# Patient Record
Sex: Male | Born: 1965
Health system: Southern US, Community
[De-identification: ages and names within clinical notes are randomized; demographics above are authoritative.]

## PROBLEM LIST (undated history)

## (undated) DIAGNOSIS — Z87442 Personal history of urinary calculi: Secondary | ICD-10-CM

## (undated) DIAGNOSIS — I10 Essential (primary) hypertension: Secondary | ICD-10-CM

## (undated) DIAGNOSIS — J069 Acute upper respiratory infection, unspecified: Secondary | ICD-10-CM

## (undated) DIAGNOSIS — G4733 Obstructive sleep apnea (adult) (pediatric): Secondary | ICD-10-CM

## (undated) DIAGNOSIS — Z8719 Personal history of other diseases of the digestive system: Secondary | ICD-10-CM

## (undated) HISTORY — DX: Essential (primary) hypertension: I10

---
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
## 1998-12-02 HISTORY — PX: CYSTOSCOPY/RETROGRADE/URETEROSCOPY/STONE EXTRACTION WITH BASKET: SHX5317

## 2009-04-04 ENCOUNTER — Encounter: Admission: RE | Admit: 2009-04-04 | Discharge: 2009-04-04 | Payer: Self-pay | Admitting: Family Medicine

## 2013-12-02 HISTORY — PX: KNEE ARTHROSCOPY W/ MENISCECTOMY: SHX1879

## 2014-01-13 ENCOUNTER — Other Ambulatory Visit: Payer: Self-pay | Admitting: Orthopedic Surgery

## 2014-01-13 DIAGNOSIS — M25561 Pain in right knee: Secondary | ICD-10-CM

## 2014-01-14 ENCOUNTER — Ambulatory Visit
Admission: RE | Admit: 2014-01-14 | Discharge: 2014-01-14 | Disposition: A | Discharge status: Home / Self Care | Payer: BC Managed Care – PPO | Source: Ambulatory Visit | Attending: Orthopedic Surgery | Admitting: Orthopedic Surgery

## 2014-01-14 DIAGNOSIS — M25561 Pain in right knee: Secondary | ICD-10-CM

## 2015-07-26 ENCOUNTER — Other Ambulatory Visit: Payer: Self-pay | Admitting: Physician Assistant

## 2015-07-26 DIAGNOSIS — K5792 Diverticulitis of intestine, part unspecified, without perforation or abscess without bleeding: Secondary | ICD-10-CM

## 2015-07-26 DIAGNOSIS — R1032 Left lower quadrant pain: Secondary | ICD-10-CM

## 2015-07-31 ENCOUNTER — Ambulatory Visit
Admission: RE | Admit: 2015-07-31 | Discharge: 2015-07-31 | Disposition: A | Discharge status: Home / Self Care | Payer: 59 | Source: Ambulatory Visit | Attending: Physician Assistant | Admitting: Physician Assistant

## 2015-07-31 DIAGNOSIS — K5792 Diverticulitis of intestine, part unspecified, without perforation or abscess without bleeding: Secondary | ICD-10-CM

## 2015-07-31 DIAGNOSIS — R1032 Left lower quadrant pain: Secondary | ICD-10-CM

## 2015-07-31 MED ORDER — IOPAMIDOL (ISOVUE-300) INJECTION 61%
125.0000 mL | Freq: Once | INTRAVENOUS | Status: AC | PRN
  Administered 2015-07-31: 125 mL via INTRAVENOUS

## 2016-12-22 DIAGNOSIS — J0100 Acute maxillary sinusitis, unspecified: Secondary | ICD-10-CM | POA: Diagnosis not present

## 2016-12-22 DIAGNOSIS — J209 Acute bronchitis, unspecified: Secondary | ICD-10-CM | POA: Diagnosis not present

## 2017-01-13 ENCOUNTER — Other Ambulatory Visit: Payer: Self-pay | Admitting: Urology

## 2017-01-14 ENCOUNTER — Encounter (HOSPITAL_BASED_OUTPATIENT_CLINIC_OR_DEPARTMENT_OTHER): Payer: Self-pay | Admitting: *Deleted

## 2017-01-14 NOTE — Progress Notes (Signed)
NPO AFTER MN.  ARRIVE AT 0715.  NEEDS HG. 

## 2017-01-17 ENCOUNTER — Ambulatory Visit (HOSPITAL_BASED_OUTPATIENT_CLINIC_OR_DEPARTMENT_OTHER): Payer: 59 | Admitting: Anesthesiology

## 2017-01-17 ENCOUNTER — Encounter (HOSPITAL_BASED_OUTPATIENT_CLINIC_OR_DEPARTMENT_OTHER): Payer: Self-pay | Admitting: *Deleted

## 2017-01-17 ENCOUNTER — Encounter (HOSPITAL_BASED_OUTPATIENT_CLINIC_OR_DEPARTMENT_OTHER)
Admission: RE | Disposition: A | Discharge status: Home / Self Care | Payer: Self-pay | Source: Ambulatory Visit | Attending: Urology

## 2017-01-17 ENCOUNTER — Ambulatory Visit (HOSPITAL_BASED_OUTPATIENT_CLINIC_OR_DEPARTMENT_OTHER)
Admission: RE | Admit: 2017-01-17 | Discharge: 2017-01-17 | Disposition: A | Discharge status: Home / Self Care | Payer: 59 | Source: Ambulatory Visit | Attending: Urology | Admitting: Urology

## 2017-01-17 DIAGNOSIS — G4730 Sleep apnea, unspecified: Secondary | ICD-10-CM | POA: Insufficient documentation

## 2017-01-17 DIAGNOSIS — K219 Gastro-esophageal reflux disease without esophagitis: Secondary | ICD-10-CM | POA: Diagnosis not present

## 2017-01-17 DIAGNOSIS — Z302 Encounter for sterilization: Secondary | ICD-10-CM | POA: Insufficient documentation

## 2017-01-17 DIAGNOSIS — Z79899 Other long term (current) drug therapy: Secondary | ICD-10-CM | POA: Diagnosis not present

## 2017-01-17 DIAGNOSIS — F419 Anxiety disorder, unspecified: Secondary | ICD-10-CM | POA: Insufficient documentation

## 2017-01-17 DIAGNOSIS — E7800 Pure hypercholesterolemia, unspecified: Secondary | ICD-10-CM | POA: Diagnosis not present

## 2017-01-17 HISTORY — DX: Obstructive sleep apnea (adult) (pediatric): G47.33

## 2017-01-17 HISTORY — PX: VASECTOMY: SHX75

## 2017-01-17 HISTORY — DX: Acute upper respiratory infection, unspecified: J06.9

## 2017-01-17 HISTORY — DX: Personal history of other diseases of the digestive system: Z87.19

## 2017-01-17 HISTORY — DX: Personal history of urinary calculi: Z87.442

## 2017-01-17 LAB — POCT HEMOGLOBIN-HEMACUE: Hemoglobin: 15.9 g/dL (ref 13.0–17.0)

## 2017-01-17 SURGERY — VASECTOMY
Anesthesia: General | Site: Scrotum | Laterality: Bilateral

## 2017-01-17 MED ORDER — DEXAMETHASONE SODIUM PHOSPHATE 10 MG/ML IJ SOLN
INTRAMUSCULAR | Status: AC
  Filled 2017-01-17: qty 1

## 2017-01-17 MED ORDER — LACTATED RINGERS IV SOLN
INTRAVENOUS | Status: DC
  Administered 2017-01-17: 08:00:00 via INTRAVENOUS
  Filled 2017-01-17: qty 1000

## 2017-01-17 MED ORDER — LIDOCAINE HCL (CARDIAC) 20 MG/ML IV SOLN
INTRAVENOUS | Status: DC | PRN
  Administered 2017-01-17: 100 mg via INTRAVENOUS

## 2017-01-17 MED ORDER — TRAMADOL-ACETAMINOPHEN 37.5-325 MG PO TABS: 1.0000 | ORAL_TABLET | Freq: Four times a day (QID) | ORAL | 0 refills | Status: DC | PRN

## 2017-01-17 MED ORDER — PROPOFOL 10 MG/ML IV BOLUS
INTRAVENOUS | Status: AC
  Filled 2017-01-17: qty 40

## 2017-01-17 MED ORDER — CEFAZOLIN SODIUM-DEXTROSE 2-4 GM/100ML-% IV SOLN
INTRAVENOUS | Status: AC
  Filled 2017-01-17: qty 100

## 2017-01-17 MED ORDER — FENTANYL CITRATE (PF) 100 MCG/2ML IJ SOLN
INTRAMUSCULAR | Status: DC | PRN
  Administered 2017-01-17: 50 ug via INTRAVENOUS

## 2017-01-17 MED ORDER — KETOROLAC TROMETHAMINE 30 MG/ML IJ SOLN
INTRAMUSCULAR | Status: AC
  Filled 2017-01-17: qty 1

## 2017-01-17 MED ORDER — MIDAZOLAM HCL 5 MG/5ML IJ SOLN
INTRAMUSCULAR | Status: DC | PRN
  Administered 2017-01-17: 2 mg via INTRAVENOUS

## 2017-01-17 MED ORDER — PROMETHAZINE HCL 25 MG/ML IJ SOLN
6.2500 mg | INTRAMUSCULAR | Status: DC | PRN
  Filled 2017-01-17: qty 1

## 2017-01-17 MED ORDER — ONDANSETRON HCL 4 MG/2ML IJ SOLN
INTRAMUSCULAR | Status: DC | PRN
  Administered 2017-01-17: 4 mg via INTRAVENOUS

## 2017-01-17 MED ORDER — MIDAZOLAM HCL 2 MG/2ML IJ SOLN
INTRAMUSCULAR | Status: AC
  Filled 2017-01-17: qty 2

## 2017-01-17 MED ORDER — ONDANSETRON HCL 4 MG PO TABS: 4.0000 mg | ORAL_TABLET | Freq: Four times a day (QID) | ORAL | 1 refills | Status: DC | PRN

## 2017-01-17 MED ORDER — LIDOCAINE 2% (20 MG/ML) 5 ML SYRINGE
INTRAMUSCULAR | Status: AC
  Filled 2017-01-17: qty 5

## 2017-01-17 MED ORDER — CEFAZOLIN SODIUM-DEXTROSE 2-4 GM/100ML-% IV SOLN
2.0000 g | INTRAVENOUS | Status: AC
  Administered 2017-01-17: 2 g via INTRAVENOUS
  Filled 2017-01-17: qty 100

## 2017-01-17 MED ORDER — ONDANSETRON HCL 4 MG/2ML IJ SOLN
INTRAMUSCULAR | Status: AC
  Filled 2017-01-17: qty 2

## 2017-01-17 MED ORDER — DEXAMETHASONE SODIUM PHOSPHATE 4 MG/ML IJ SOLN
INTRAMUSCULAR | Status: DC | PRN
  Administered 2017-01-17: 10 mg via INTRAVENOUS

## 2017-01-17 MED ORDER — BUPIVACAINE HCL (PF) 0.25 % IJ SOLN
INTRAMUSCULAR | Status: DC | PRN
  Administered 2017-01-17: 5 mL

## 2017-01-17 MED ORDER — FENTANYL CITRATE (PF) 100 MCG/2ML IJ SOLN
25.0000 ug | INTRAMUSCULAR | Status: DC | PRN
  Filled 2017-01-17: qty 1

## 2017-01-17 MED ORDER — KETOROLAC TROMETHAMINE 30 MG/ML IJ SOLN
INTRAMUSCULAR | Status: DC | PRN
  Administered 2017-01-17: 30 mg via INTRAVENOUS

## 2017-01-17 MED ORDER — FENTANYL CITRATE (PF) 100 MCG/2ML IJ SOLN
INTRAMUSCULAR | Status: AC
  Filled 2017-01-17: qty 2

## 2017-01-17 MED ORDER — PROPOFOL 10 MG/ML IV BOLUS
INTRAVENOUS | Status: DC | PRN
  Administered 2017-01-17: 300 mg via INTRAVENOUS

## 2017-01-17 SURGICAL SUPPLY — 39 items
APPLICATOR COTTON TIP 6IN STRL (MISCELLANEOUS) IMPLANT
BLADE CLIPPER SURG (BLADE) IMPLANT
BLADE SURG 15 STRL LF DISP TIS (BLADE) ×1 IMPLANT
BLADE SURG 15 STRL SS (BLADE) ×2
BNDG GAUZE ELAST 4 BULKY (GAUZE/BANDAGES/DRESSINGS) ×3 IMPLANT
CLEANER CAUTERY TIP 5X5 PAD (MISCELLANEOUS) ×1 IMPLANT
CLOTH BEACON ORANGE TIMEOUT ST (SAFETY) IMPLANT
COVER BACK TABLE 60X90IN (DRAPES) ×3 IMPLANT
COVER MAYO STAND STRL (DRAPES) ×3 IMPLANT
DERMABOND ADVANCED (GAUZE/BANDAGES/DRESSINGS) ×2
DERMABOND ADVANCED .7 DNX12 (GAUZE/BANDAGES/DRESSINGS) ×1 IMPLANT
DRAPE LAPAROTOMY 100X72 PEDS (DRAPES) ×3 IMPLANT
ELECT NEEDLE TIP 2.8 STRL (NEEDLE) ×3 IMPLANT
ELECT REM PT RETURN 9FT ADLT (ELECTROSURGICAL) ×3
ELECTRODE REM PT RTRN 9FT ADLT (ELECTROSURGICAL) ×1 IMPLANT
GLOVE BIO SURGEON STRL SZ7.5 (GLOVE) ×3 IMPLANT
GLOVE BIOGEL PI IND STRL 7.0 (GLOVE) ×2 IMPLANT
GLOVE BIOGEL PI IND STRL 7.5 (GLOVE) ×1 IMPLANT
GLOVE BIOGEL PI INDICATOR 7.0 (GLOVE) ×4
GLOVE BIOGEL PI INDICATOR 7.5 (GLOVE) ×2
GOWN STRL REUS W/TWL LRG LVL3 (GOWN DISPOSABLE) ×3 IMPLANT
GOWN STRL REUS W/TWL XL LVL3 (GOWN DISPOSABLE) ×3 IMPLANT
GOWN W/2 COTTON TOWELS 2 STD (GOWNS) IMPLANT
KIT ROOM TURNOVER WOR (KITS) ×3 IMPLANT
MANIFOLD NEPTUNE II (INSTRUMENTS) IMPLANT
NEEDLE HYPO 25X5/8 SAFETYGLIDE (NEEDLE) ×3 IMPLANT
NS IRRIG 500ML POUR BTL (IV SOLUTION) ×3 IMPLANT
PACK BASIN DAY SURGERY FS (CUSTOM PROCEDURE TRAY) ×3 IMPLANT
PAD CLEANER CAUTERY TIP 5X5 (MISCELLANEOUS) ×2
PENCIL BUTTON HOLSTER BLD 10FT (ELECTRODE) ×3 IMPLANT
SUT MON AB 5-0 PS2 18 (SUTURE) IMPLANT
SUT VIC AB 3-0 SH 27 (SUTURE) ×2
SUT VIC AB 3-0 SH 27X BRD (SUTURE) ×1 IMPLANT
SYR CONTROL 10ML LL (SYRINGE) ×3 IMPLANT
TOWEL OR 17X24 6PK STRL BLUE (TOWEL DISPOSABLE) ×3 IMPLANT
TRAY DSU PREP LF (CUSTOM PROCEDURE TRAY) ×3 IMPLANT
TUBE CONNECTING 12'X1/4 (SUCTIONS)
TUBE CONNECTING 12X1/4 (SUCTIONS) IMPLANT
WATER STERILE IRR 500ML POUR (IV SOLUTION) IMPLANT

## 2017-01-17 NOTE — Anesthesia Postprocedure Evaluation (Signed)
Anesthesia Post Note  Patient: Raymond Parsons  Procedure(s) Performed: Procedure(s) (LRB): VASECTOMY (Bilateral)  Patient location during evaluation: PACU Anesthesia Type: General Level of consciousness: awake and alert Pain management: pain level controlled Vital Signs Assessment: post-procedure vital signs reviewed and stable Respiratory status: spontaneous breathing, nonlabored ventilation, respiratory function stable and patient connected to nasal cannula oxygen Cardiovascular status: blood pressure returned to baseline and stable Postop Assessment: no signs of nausea or vomiting Anesthetic complications: no       Last Vitals:  Vitals:   01/17/17 0945 01/17/17 1000  BP: 121/89 (!) 131/91  Pulse: 81 77  Resp: (!) 9 14  Temp:      Last Pain:  Vitals:   01/17/17 0748  TempSrc: Oral                 Gelena Klosinski S

## 2017-01-17 NOTE — Transfer of Care (Signed)
Immediate Anesthesia Transfer of Care Note  Patient: Raymond Parsons  Procedure(s) Performed: Procedure(s): VASECTOMY (Bilateral)  Patient Location: PACU  Anesthesia Type:General  Level of Consciousness: awake, alert  and oriented  Airway & Oxygen Therapy: Patient Spontanous Breathing and Patient connected to nasal cannula oxygen  Post-op Assessment: Report given to RN  Post vital signs: Reviewed and stable  Last Vitals: 128/93, 92, 14, 97% Vitals:   01/17/17 0748  BP: (!) 145/108  Pulse: 94  Resp: 18  Temp: 37.6 C    Last Pain:  Vitals:   01/17/17 0748  TempSrc: Oral      Patients Stated Pain Goal: 7 ([id number])  Complications: No apparent anesthesia complications

## 2017-01-17 NOTE — Anesthesia Procedure Notes (Signed)
Procedure Name: LMA Insertion Date/Time: 01/17/2017 8:40 AM Performed by: Bethena Roys T Pre-anesthesia Checklist: Patient identified, Emergency Drugs available, Suction available and Patient being monitored Patient Re-evaluated:Patient Re-evaluated prior to inductionOxygen Delivery Method: Circle system utilized Preoxygenation: Pre-oxygenation with 100% oxygen Intubation Type: IV induction Ventilation: Mask ventilation without difficulty LMA: LMA inserted LMA Size: 5.0 Number of attempts: 1 Airway Equipment and Method: Bite block Placement Confirmation: positive ETCO2 Tube secured with: Tape Dental Injury: Teeth and Oropharynx as per pre-operative assessment

## 2017-01-17 NOTE — Interval H&P Note (Signed)
History and Physical Interval Note:  01/17/2017 8:34 AM  Raymond Parsons  has presented today for surgery, with the diagnosis of ELECTIVE STERILIZATION  The various methods of treatment have been discussed with the patient and family. After consideration of risks, benefits and other options for treatment, the patient has consented to  Procedure(s): VASECTOMY (Bilateral) as a surgical intervention .  The patient's history has been reviewed, patient examined, no change in status, stable for surgery.  I have reviewed the patient's chart and labs.  Questions were answered to the patient's satisfaction.     Raymond Parsons  Pt seen this AM. Discussed surgery and aftercare with pt and wife. Ice, ibuprofen, and use of birth control until cleared under microscope ( 20-30 ejaculations-6-12 weeks). Will have dressing in place. Pt desires nausea med at home post op.

## 2017-01-17 NOTE — Anesthesia Preprocedure Evaluation (Signed)
Anesthesia Evaluation  Patient identified by MRN, date of birth, ID band Patient awake    Reviewed: Allergy & Precautions, NPO status , Patient's Chart, lab work & pertinent test results  Airway Mallampati: II  TM Distance: >3 FB Neck ROM: Full    Dental no notable dental hx.    Pulmonary sleep apnea ,    Pulmonary exam normal breath sounds clear to auscultation       Cardiovascular negative cardio ROS Normal cardiovascular exam Rhythm:Regular Rate:Normal     Neuro/Psych negative neurological ROS  negative psych ROS   GI/Hepatic negative GI ROS, Neg liver ROS,   Endo/Other  negative endocrine ROS  Renal/GU negative Renal ROS  negative genitourinary   Musculoskeletal negative musculoskeletal ROS (+)   Abdominal   Peds negative pediatric ROS (+)  Hematology negative hematology ROS (+)   Anesthesia Other Findings   Reproductive/Obstetrics negative OB ROS                             Anesthesia Physical Anesthesia Plan  ASA: II  Anesthesia Plan: General   Post-op Pain Management:    Induction: Intravenous  Airway Management Planned: LMA  Additional Equipment:   Intra-op Plan:   Post-operative Plan:   Informed Consent: I have reviewed the patients History and Physical, chart, labs and discussed the procedure including the risks, benefits and alternatives for the proposed anesthesia with the patient or authorized representative who has indicated his/her understanding and acceptance.   Dental advisory given  Plan Discussed with: CRNA and Surgeon  Anesthesia Plan Comments:         Anesthesia Quick Evaluation

## 2017-01-17 NOTE — Op Note (Signed)
Pre-operative diagnosis :  Elective sterilization  Postoperative diagnosis:  same  Operation:  Vasectomy  Surgeon:  S. Gaynelle Arabian, MD  First assistant:  None  Anesthesia: General LMA  Preparation:  After appropriate preanesthesia, the patient was brought to the operating, placed on the operating table in the dorsal supine position where general LMA anesthesia was introduced. The scrotum and penis were prepped with Betadine solution and draped in the usual fashion.  Review history:  Kenna Fogelman is a 51 year-old male patient who was referred by Dr. Antony Contras, MD who is here to discuss a vasectomy.  He has read the Vasectomy Patient Info Book provided to him. He does understand that a vasectomy is permanent. He is certain that he and his mate do not want any more children. He has 2 children. All of the patient's children are healthy.   He does understand that pregnancy after a vasectomy reversal is not assured. He does understand that reestablishment of sperm flow through the vas deferens is extremely rare but possible and may result in an unexpected pregnancy at any time after his vasectomy. He does understand that birth control protection must be used after his vasectomy until he has had 2 clear sperm counts and is cleared for unprotected intercourse.    On physical exam, the  patient is found to have a very tight scrotum, with thickened spermatic cord. The vas is palpable, but would present difficult office vasectomy. I have counseled him to have vasectomy under anesthesia, which would allow him to have less dissection, with faster healing, and less postop edema. He is intolerant of codeine, and will take ibuprofen postop, and have Marcaine in the wound.       Statement of  Likelihood of Success: Excellent. TIME-OUT observed.:  Procedure:  The vas was palpated bilaterally, and was noted to be buried within the spermatic cord. The vas was isolated as much as possible, and a 5 mm  incision was made bilaterally. Subcutaneous tissue was dissected with the sharp dissecting clamp, and the vas clamp was passed within the scrotum, and the vas and its surrounding tissue was brought to the surface and dissected. Once dissected, 0.25 plain Marcaine was injected in the proximal and distal sheath of the vas, as well as in the wound edges. 2 clamps were then placed on the proximal and distal portions of the vas, and using the electrosurgical unit, a portion of the vas removed. Each end was then ligated with 2-0 Vicryl suture.  The wounds were then closed in 2 layers with 4-0 Monocryl suture. Skin edges were closed with a single 4-0 Monocryl suture. Each side was identified and operated upon identically. Each portion of vas was sent to the laboratory individually for identification. The patient was given IV Toradol at the end of the procedure. He received 2 g of IV Ancef at the beginning of the procedure.  He was awakened and taken to the recovery room after sterile dressing was applied.

## 2017-01-17 NOTE — H&P (Signed)
Office Visit Report     09/18/2016   --------------------------------------------------------------------------------   Raymond Parsons  MRN: [id number]  PRIMARY CARE:  Antony Contras, MD  DOB: 08/06/1962, 51 year old Male  REFERRING:  Antony Contras, MD   PROVIDER:  Carolan Clines, M.D.    LOCATION:  [facility] [phone]   --------------------------------------------------------------------------------   CC: I want to discuss a vasectomy.  HPI: Raymond Parsons is a 51 year-old male patient who was referred by Dr. Antony Contras, MD who is here to discuss a vasectomy.  He has read the Vasectomy Patient Info Book provided to him. He does understand that a vasectomy is permanent. He is certain that he and his mate do not want any more children. He has 2 children. All of the patient's children are healthy.   He does understand that pregnancy after a vasectomy reversal is not assured. He does understand that reestablishment of sperm flow through the vas deferens is extremely rare but possible and may result in an unexpected pregnancy at any time after his vasectomy. He does understand that birth control protection must be used after his vasectomy until he has had 2 clear sperm counts and is cleared for unprotected intercourse.     ALLERGIES: Cipro - Hives Codeine - Hives Hydrocodone - Hives    MEDICATIONS: Zyrtec  Fiber  Fish Oil  Probiotic  Red Yeast Rice     GU PSH: Cysto Uretero Remove Stone    NON-GU PSH: Tonsillectomy    GU PMH: None   NON-GU PMH: Anxiety GERD Hypercholesterolemia Sleep Apnea    FAMILY HISTORY: None   SOCIAL HISTORY: Marital Status: Married Current Smoking Status: Patient has never smoked.  Drinks 1 drink per day.  Drinks 2 caffeinated drinks per day. 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     Notes: 2 daughters   REVIEW OF SYSTEMS:    GU Review Male:   Patient reports get up at night to urinate. Patient denies  frequent urination, hard to postpone urination, burning/ pain with urination, leakage of urine, stream starts and stops, trouble starting your stream, have to strain to urinate , erection problems, and penile pain.  Gastrointestinal (Upper):   Patient denies nausea, vomiting, and indigestion/ heartburn.  Gastrointestinal (Lower):   Patient denies diarrhea and constipation.  Constitutional:   Patient denies fever, night sweats, weight loss, and fatigue.  Skin:   Patient denies itching and skin rash/ lesion.  Eyes:   Patient denies blurred vision and double vision.  Ears/ Nose/ Throat:   Patient denies sore throat and sinus problems.  Hematologic/Lymphatic:   Patient denies swollen glands and easy bruising.  Cardiovascular:   Patient denies leg swelling and chest pains.  Respiratory:   Patient denies cough and shortness of breath.  Endocrine:   Patient denies excessive thirst.  Musculoskeletal:   Patient denies back pain and joint pain.  Neurological:   Patient denies headaches and dizziness.  Psychologic:   Patient denies depression and anxiety.   VITAL SIGNS:      09/18/2016 08:25 AM  Weight 216 lb / 97.98 kg  Height 70 in / 177.8 cm  BP 133/83 mmHg  Pulse 80 /min  BMI 31.0 kg/m   GU PHYSICAL EXAMINATION:    Anus and Perineum: No hemorrhoids. No anal stenosis. No rectal fissure, no anal fissure. No edema, no dimple, no perineal tenderness, no anal tenderness.  Scrotum: No lesions. No edema. No cysts. No warts.  Epididymides: Right: no spermatocele, no masses,  no cysts, no tenderness, no induration, no enlargement. Left: no spermatocele, no masses, no cysts, no tenderness, no induration, no enlargement.  Testes: No tenderness, no swelling, no enlargement left testis. No tenderness, no swelling, no enlargement right testis. Normal location left testis. Normal location right testis. No mass, no cyst, no varicocele, no hydrocele left testis. No mass, no cyst, no varicocele, no hydrocele right  testis. scrotum: Very tight scrotum. The vas difficult to palpate bilaterally.  Urethral Meatus: Normal size. No lesion, no wart, no discharge, no polyp. Normal location.  Penis: Circumcised, no warts, no cracks. No dorsal Peyronie's plaques, no left corporal Peyronie's plaques, no right corporal Peyronie's plaques, no scarring, no warts. No balanitis, no meatal stenosis.  Prostate: 40 gram or 2+ size. Left lobe normal consistency, right lobe normal consistency. Symmetrical lobes. No prostate nodule. Left lobe no tenderness, right lobe no tenderness.  Seminal Vesicles: Nonpalpable.  Sphincter Tone: Normal sphincter. No rectal tenderness. No rectal mass.    MULTI-SYSTEM PHYSICAL EXAMINATION:    Constitutional: Well-nourished. No physical deformities. Normally developed. Good grooming.  Neck: Neck symmetrical, not swollen. Normal tracheal position.  Respiratory: No labored breathing, no use of accessory muscles.   Cardiovascular: Normal temperature, normal extremity pulses, no swelling, no varicosities.  Lymphatic: No enlargement of neck, axillae, groin.  Skin: No paleness, no jaundice, no cyanosis. No lesion, no ulcer, no rash.  Neurologic / Psychiatric: Oriented to time, oriented to place, oriented to person. No depression, no anxiety, no agitation.  Gastrointestinal: No mass, no tenderness, no rigidity, non obese abdomen.  Eyes: Normal conjunctivae. Normal eyelids.  Ears, Nose, Mouth, and Throat: Left ear no scars, no lesions, no masses. Right ear no scars, no lesions, no masses. Nose no scars, no lesions, no masses. Normal hearing. Normal lips.  Musculoskeletal: Normal gait and station of head and neck.     PAST DATA REVIEWED:  Source Of History:  Patient   PROCEDURES: None   ASSESSMENT:      ICD-10 Details  1 GU:   Encounter for sterilization - Z30.2           Notes:   51 year old male, for vasectomy consultation today. The patient is found to have a very tight scrotum, with thickened  spermatic cord. The vas is palpable, but would present difficult office vasectomy. I have counseled him to have vasectomy under anesthesia, which would allow him to have less dissection, with faster healing, and less postop edema. He is allergic to codeine, and will take ibuprofen postop, and have Marcaine in the wound.    PLAN:           Orders Labs PSA with Reflex          Schedule Return Visit: Next Available Appointment - Schedule Surgery          Document Letter(s):  Created for Patient: Clinical Summary         The information contained in this medical record document is considered private and confidential patient information. This information can only be used for the medical diagnosis and/or medical services that are being provided by the patient's selected caregivers. This information can only be distributed outside of the patient's care if the patient agrees and signs waivers of authorization for this information to be sent to an outside source or route.

## 2017-01-17 NOTE — Discharge Instructions (Addendum)
Vasectomy, Care After Refer to this sheet in the next few weeks. These instructions provide you with information on caring for yourself after your procedure. Your health care provider may also give you more specific instructions. Your treatment has been planned according to current medical practices, but problems sometimes occur. Call your health care provider if you have any problems or questions after your procedure. WHAT TO EXPECT AFTER THE PROCEDURE After your procedure, it is typical to have the following:  Slight swelling or redness or both at the surgical site.  Mild pain or discomfort in the scrotum.  Some oozing of blood from the cuts (incisions) made by the surgeon is normal during the first day or two after the procedure.  Blood in the ejaculate is common and typically clears after a few days. HOME CARE INSTRUCTIONS   Only take over-the-counter or prescription medicines for pain, discomfort, or fever as directed by your health care provider.  Avoid using nonsteroidal anti-inflammatory drugs (NSAIDs) because these can make bleeding worse.  Apply ice to the injured area:  Put ice in a plastic bag.  Place a towel between your skin and the bag.  Leave the ice on for 20 minutes, 2-3 times a day.  Avoid being active for the first 2 days after surgery.  Wear a supporter while moving around for the first week after surgery. You may add some sterile fluffed bandages or a clean washcloth to the scrotal support if the scrotal support irritates your skin.  Do not participate in sports or perform heavy physical labor for at least 2 weeks.  You may have protected intercourse 7-10 days after your procedure. Remember, you are not sterile until follow-up specimens show no sperm in your ejaculate.  Be sure to follow up with your surgeon as instructed to confirm sterility. It usually requires multiple ejaculations to clear the sperm located beyond the vasectomy site of blockage. You will  need at least two specimens showing an absence of sperm before you can resume unprotected intercourse. SEEK MEDICAL CARE IF:   You have redness, swelling, or increasing pain in the wounds or testicles (scrotum).  You see pus coming from the wound.  You have a fever.  You notice a foul smell coming from the wound or dressing.  You notice a breaking open of the stitches (suture) line or wound edges even after sutures have been removed.  You have increased bleeding from the wounds. SEEK IMMEDIATE MEDICAL CARE IF:   You develop a rash.  You have difficulty breathing.  You have any reaction or side effects to medicines given. MAKE SURE YOU:  Understand these instructions.  Will watch your condition.  Will get help right away if you are not doing well or get worse. This information is not intended to replace advice given to you by your health care provider. Make sure you discuss any questions you have with your health care provider. Document Released: 06/07/2005 Document Revised: 11/23/2013 Document Reviewed: 06/07/2013 Elsevier Interactive Patient Education  2017 [facility] Instructions  Activity: Get plenty of rest for the remainder of the day. A responsible adult should stay with you for 24 hours following the procedure.  For the next 24 hours, DO NOT: -Drive a car -Paediatric nurse -Drink alcoholic beverages -Take any medication unless instructed by your physician -Make any legal decisions or sign important papers.  Meals: Start with liquid foods such as gelatin or soup. Progress to regular foods as tolerated. Avoid greasy,  spicy, heavy foods. If nausea and/or vomiting occur, drink only clear liquids until the nausea and/or vomiting subsides. Call your physician if vomiting continues.  Special Instructions/Symptoms: Your throat may feel dry or sore from the anesthesia or the breathing tube placed in your throat during surgery. If this  causes discomfort, gargle with warm salt water. The discomfort should disappear within 24 hours.  If you had a scopolamine patch placed behind your ear for the management of post- operative nausea and/or vomiting:  1. The medication in the patch is effective for 72 hours, after which it should be removed.  Wrap patch in a tissue and discard in the trash. Wash hands thoroughly with soap and water. 2. You may remove the patch earlier than 72 hours if you experience unpleasant side effects which may include dry mouth, dizziness or visual disturbances. 3. Avoid touching the patch. Wash your hands with soap and water after contact with the patch.

## 2017-01-20 ENCOUNTER — Encounter (HOSPITAL_BASED_OUTPATIENT_CLINIC_OR_DEPARTMENT_OTHER): Payer: Self-pay | Admitting: Urology

## 2017-01-30 DIAGNOSIS — R05 Cough: Secondary | ICD-10-CM | POA: Diagnosis not present

## 2017-01-30 DIAGNOSIS — J40 Bronchitis, not specified as acute or chronic: Secondary | ICD-10-CM | POA: Diagnosis not present

## 2017-02-03 DIAGNOSIS — R05 Cough: Secondary | ICD-10-CM | POA: Diagnosis not present

## 2017-02-03 DIAGNOSIS — E784 Other hyperlipidemia: Secondary | ICD-10-CM | POA: Diagnosis not present

## 2017-02-03 DIAGNOSIS — J302 Other seasonal allergic rhinitis: Secondary | ICD-10-CM | POA: Diagnosis not present

## 2017-02-04 DIAGNOSIS — Z0000 Encounter for general adult medical examination without abnormal findings: Secondary | ICD-10-CM | POA: Diagnosis not present

## 2017-02-05 DIAGNOSIS — Z0000 Encounter for general adult medical examination without abnormal findings: Secondary | ICD-10-CM | POA: Diagnosis not present

## 2017-02-11 DIAGNOSIS — E784 Other hyperlipidemia: Secondary | ICD-10-CM | POA: Diagnosis not present

## 2017-02-11 DIAGNOSIS — Z0000 Encounter for general adult medical examination without abnormal findings: Secondary | ICD-10-CM | POA: Diagnosis not present

## 2017-02-11 DIAGNOSIS — R7309 Other abnormal glucose: Secondary | ICD-10-CM | POA: Diagnosis not present

## 2017-02-11 DIAGNOSIS — Z23 Encounter for immunization: Secondary | ICD-10-CM | POA: Diagnosis not present

## 2017-02-11 DIAGNOSIS — Z1389 Encounter for screening for other disorder: Secondary | ICD-10-CM | POA: Diagnosis not present

## 2017-02-13 DIAGNOSIS — Z1212 Encounter for screening for malignant neoplasm of rectum: Secondary | ICD-10-CM | POA: Diagnosis not present

## 2017-05-05 NOTE — Addendum Note (Signed)
Addendum  created 05/05/17 1116 by Myrtie Soman, MD   Sign clinical note

## 2017-05-05 NOTE — Anesthesia Postprocedure Evaluation (Signed)
Anesthesia Post Note  Patient: Raymond Parsons  Procedure(s) Performed: Procedure(s) (LRB): VASECTOMY (Bilateral)     Anesthesia Post Evaluation  Last Vitals:  Vitals:   01/17/17 1000 01/17/17 1025  BP: (!) 131/91 128/84  Pulse: 77 71  Resp: 14 16  Temp:  36.8 C    Last Pain:  Vitals:   01/17/17 0748  TempSrc: Oral                 Jesslyn Viglione S

## 2017-07-15 DIAGNOSIS — R030 Elevated blood-pressure reading, without diagnosis of hypertension: Secondary | ICD-10-CM | POA: Diagnosis not present

## 2017-07-15 DIAGNOSIS — R0789 Other chest pain: Secondary | ICD-10-CM | POA: Diagnosis not present

## 2017-08-20 DIAGNOSIS — R7309 Other abnormal glucose: Secondary | ICD-10-CM | POA: Diagnosis not present

## 2017-09-03 DIAGNOSIS — E781 Pure hyperglyceridemia: Secondary | ICD-10-CM | POA: Diagnosis not present

## 2017-09-03 DIAGNOSIS — L258 Unspecified contact dermatitis due to other agents: Secondary | ICD-10-CM | POA: Diagnosis not present

## 2017-09-03 DIAGNOSIS — E7849 Other hyperlipidemia: Secondary | ICD-10-CM | POA: Diagnosis not present

## 2017-12-05 DIAGNOSIS — E7849 Other hyperlipidemia: Secondary | ICD-10-CM | POA: Diagnosis not present

## 2017-12-05 DIAGNOSIS — M65849 Other synovitis and tenosynovitis, unspecified hand: Secondary | ICD-10-CM | POA: Diagnosis not present

## 2017-12-05 DIAGNOSIS — E781 Pure hyperglyceridemia: Secondary | ICD-10-CM | POA: Diagnosis not present

## 2017-12-24 DIAGNOSIS — J0100 Acute maxillary sinusitis, unspecified: Secondary | ICD-10-CM | POA: Diagnosis not present

## 2017-12-24 DIAGNOSIS — E781 Pure hyperglyceridemia: Secondary | ICD-10-CM | POA: Diagnosis not present

## 2017-12-24 DIAGNOSIS — R05 Cough: Secondary | ICD-10-CM | POA: Diagnosis not present

## 2017-12-24 DIAGNOSIS — J302 Other seasonal allergic rhinitis: Secondary | ICD-10-CM | POA: Diagnosis not present

## 2018-01-02 DIAGNOSIS — E781 Pure hyperglyceridemia: Secondary | ICD-10-CM | POA: Diagnosis not present

## 2018-01-02 DIAGNOSIS — R05 Cough: Secondary | ICD-10-CM | POA: Diagnosis not present

## 2018-01-02 DIAGNOSIS — R030 Elevated blood-pressure reading, without diagnosis of hypertension: Secondary | ICD-10-CM | POA: Diagnosis not present

## 2018-02-06 DIAGNOSIS — J45909 Unspecified asthma, uncomplicated: Secondary | ICD-10-CM | POA: Diagnosis not present

## 2018-02-06 DIAGNOSIS — J302 Other seasonal allergic rhinitis: Secondary | ICD-10-CM | POA: Diagnosis not present

## 2018-02-06 DIAGNOSIS — R05 Cough: Secondary | ICD-10-CM | POA: Diagnosis not present

## 2018-04-02 DIAGNOSIS — R82998 Other abnormal findings in urine: Secondary | ICD-10-CM | POA: Diagnosis not present

## 2018-04-02 DIAGNOSIS — Z0000 Encounter for general adult medical examination without abnormal findings: Secondary | ICD-10-CM | POA: Diagnosis not present

## 2018-04-09 DIAGNOSIS — E7849 Other hyperlipidemia: Secondary | ICD-10-CM | POA: Diagnosis not present

## 2018-04-09 DIAGNOSIS — R7309 Other abnormal glucose: Secondary | ICD-10-CM | POA: Diagnosis not present

## 2018-04-09 DIAGNOSIS — R030 Elevated blood-pressure reading, without diagnosis of hypertension: Secondary | ICD-10-CM | POA: Diagnosis not present

## 2018-04-09 DIAGNOSIS — Z1389 Encounter for screening for other disorder: Secondary | ICD-10-CM | POA: Diagnosis not present

## 2018-04-09 DIAGNOSIS — Z0000 Encounter for general adult medical examination without abnormal findings: Secondary | ICD-10-CM | POA: Diagnosis not present

## 2018-04-13 ENCOUNTER — Other Ambulatory Visit: Payer: Self-pay | Admitting: Internal Medicine

## 2018-04-13 DIAGNOSIS — Z1212 Encounter for screening for malignant neoplasm of rectum: Secondary | ICD-10-CM | POA: Diagnosis not present

## 2018-04-13 DIAGNOSIS — E781 Pure hyperglyceridemia: Secondary | ICD-10-CM

## 2018-04-13 DIAGNOSIS — E785 Hyperlipidemia, unspecified: Secondary | ICD-10-CM

## 2018-04-30 ENCOUNTER — Ambulatory Visit
Admission: RE | Admit: 2018-04-30 | Discharge: 2018-04-30 | Disposition: A | Discharge status: Home / Self Care | Payer: 59 | Source: Ambulatory Visit | Attending: Internal Medicine | Admitting: Internal Medicine

## 2018-04-30 DIAGNOSIS — E781 Pure hyperglyceridemia: Secondary | ICD-10-CM

## 2018-04-30 DIAGNOSIS — E785 Hyperlipidemia, unspecified: Secondary | ICD-10-CM

## 2018-05-03 ENCOUNTER — Emergency Department (HOSPITAL_COMMUNITY)
Admission: EM | Admit: 2018-05-03 | Discharge: 2018-05-03 | Disposition: A | Discharge status: Home / Self Care | Payer: 59 | Attending: Emergency Medicine | Admitting: Emergency Medicine

## 2018-05-03 ENCOUNTER — Emergency Department (HOSPITAL_COMMUNITY): Payer: 59

## 2018-05-03 DIAGNOSIS — R1030 Lower abdominal pain, unspecified: Secondary | ICD-10-CM | POA: Diagnosis present

## 2018-05-03 DIAGNOSIS — D72829 Elevated white blood cell count, unspecified: Secondary | ICD-10-CM | POA: Diagnosis not present

## 2018-05-03 DIAGNOSIS — N12 Tubulo-interstitial nephritis, not specified as acute or chronic: Secondary | ICD-10-CM | POA: Diagnosis not present

## 2018-05-03 DIAGNOSIS — Z79899 Other long term (current) drug therapy: Secondary | ICD-10-CM | POA: Insufficient documentation

## 2018-05-03 DIAGNOSIS — N390 Urinary tract infection, site not specified: Secondary | ICD-10-CM | POA: Diagnosis not present

## 2018-05-03 DIAGNOSIS — D729 Disorder of white blood cells, unspecified: Secondary | ICD-10-CM | POA: Diagnosis not present

## 2018-05-03 DIAGNOSIS — R109 Unspecified abdominal pain: Secondary | ICD-10-CM | POA: Diagnosis not present

## 2018-05-03 DIAGNOSIS — N179 Acute kidney failure, unspecified: Secondary | ICD-10-CM | POA: Diagnosis not present

## 2018-05-03 LAB — DIFFERENTIAL
BASOS PCT: 0 %
Basophils Absolute: 0 10*3/uL (ref 0.0–0.1)
EOS ABS: 0.2 10*3/uL (ref 0.0–0.7)
Eosinophils Relative: 2 %
Lymphocytes Relative: 12 %
Lymphs Abs: 1.5 10*3/uL (ref 0.7–4.0)
MONO ABS: 1.1 10*3/uL — AB (ref 0.1–1.0)
MONOS PCT: 8 %
Neutro Abs: 10 10*3/uL — ABNORMAL HIGH (ref 1.7–7.7)
Neutrophils Relative %: 78 %

## 2018-05-03 LAB — URINALYSIS, ROUTINE W REFLEX MICROSCOPIC
Bilirubin Urine: NEGATIVE
Glucose, UA: NEGATIVE mg/dL
KETONES UR: NEGATIVE mg/dL
Nitrite: POSITIVE — AB
Protein, ur: NEGATIVE mg/dL
Specific Gravity, Urine: 1.003 — ABNORMAL LOW (ref 1.005–1.030)
pH: 6 (ref 5.0–8.0)

## 2018-05-03 LAB — BASIC METABOLIC PANEL
Anion gap: 9 (ref 5–15)
BUN: 20 mg/dL (ref 6–20)
CALCIUM: 9 mg/dL (ref 8.9–10.3)
CHLORIDE: 102 mmol/L (ref 101–111)
CO2: 23 mmol/L (ref 22–32)
CREATININE: 1.34 mg/dL — AB (ref 0.61–1.24)
GFR calc non Af Amer: >60 mL/min (ref >60)
Glucose, Bld: 128 mg/dL — ABNORMAL HIGH (ref 65–99)
Potassium: 3.9 mmol/L (ref 3.5–5.1)
SODIUM: 134 mmol/L — AB (ref 135–145)

## 2018-05-03 LAB — I-STAT CG4 LACTIC ACID, ED: LACTIC ACID, VENOUS: 0.6 mmol/L (ref 0.5–1.9)

## 2018-05-03 LAB — CBC
HCT: 40.9 % (ref 39.0–52.0)
Hemoglobin: 14.3 g/dL (ref 13.0–17.0)
MCH: 31.7 pg (ref 26.0–34.0)
MCHC: 35 g/dL (ref 30.0–36.0)
MCV: 90.7 fL (ref 78.0–100.0)
Platelets: 209 10*3/uL (ref 150–400)
RBC: 4.51 MIL/uL (ref 4.22–5.81)
RDW: 12.4 % (ref 11.5–15.5)
WBC: 13.1 10*3/uL — AB (ref 4.0–10.5)

## 2018-05-03 MED ORDER — CEFTRIAXONE SODIUM 1 G IJ SOLR
1.0000 g | Freq: Once | INTRAMUSCULAR | Status: AC
  Administered 2018-05-03: 1 g via INTRAVENOUS
  Filled 2018-05-03: qty 10

## 2018-05-03 MED ORDER — SULFAMETHOXAZOLE-TRIMETHOPRIM 800-160 MG PO TABS: 1.0000 | ORAL_TABLET | Freq: Two times a day (BID) | ORAL | 0 refills | Status: AC

## 2018-05-03 MED ORDER — SODIUM CHLORIDE 0.9 % IV BOLUS
1000.0000 mL | Freq: Once | INTRAVENOUS | Status: AC
  Administered 2018-05-03: 1000 mL via INTRAVENOUS

## 2018-05-03 MED ORDER — ACETAMINOPHEN 500 MG PO TABS
1000.0000 mg | ORAL_TABLET | Freq: Once | ORAL | Status: AC
  Administered 2018-05-03: 1000 mg via ORAL
  Filled 2018-05-03: qty 2

## 2018-05-03 NOTE — ED Provider Notes (Signed)
[facility] DEPT Provider Note   CSN: [id number] Arrival date & time: 05/03/18  1833     History   Chief Complaint Chief Complaint  Patient presents with  . Flank Pain    HPI Raymond Parsons is a 52 y.o. male with a PMHx of colonic diverticulosis, nephrolithiasis requiring extraction (55yrs ago), and other conditions listed below, who presents to the ED with complaints of 4-5 days of dysuria, hematuria, malodorous urine, and increased urinary frequency and urgency, with associated fever of 101.5 starting earlier today as well as some left flank pain.  Patient states that for the last few days he had dysuria and felt like he was potentially getting "a bladder infection".  The symptoms gradually worsened today, and around 1 PM he started having left flank pain, so he called a tele-doc service and had Bactrim called in (10 day supply), took 1 dose at 2 PM.  Shortly after that he spiked a fever of 101.5, so he called his urologist [name] office ([facility]) who recommended that he come in for evaluation.  He describes his flank pain as 5/10 constant sharp left flank pain that radiates into the LLQ/left groin area, worse with urinating, and improved with Advil and Azo.  His last dose of Advil was at 5 PM, and he states that this helped bring his fever down as well.  He continues to have dysuria, increased urinary frequency, urgency, hematuria, and malodorous urine.  He has never had a UTI before but has a history of kidney stones 15 years ago, states that the flank pain today is much less severe than the flank pain with his kidney stones.  His urologist is [name] and his PCP is [name].  He endorses occasional EtOH use having ~1-2 drinks about 3x/wk.  He denies URI symptoms, cough, chills, CP, SOB, nausea/vomiting, diarrhea/constipation, melena, hematochezia, rectal pain, perineal pain, testicular pain/swelling, penile discharge, myalgias,  arthralgias, numbness, tingling, focal weakness, or any other complaints at this time. Denies recent travel, sick contacts, suspicious food intake, frequent NSAID use, or prior abd surgeries.  He is allergic to ciprofloxacin, codeine, and hydrocodone.   The history is provided by the patient and medical records. No language interpreter was used.  Flank Pain  Associated symptoms include abdominal pain. Pertinent negatives include no chest pain and no shortness of breath.    Past Medical History:  Diagnosis Date  . History of colonic diverticulitis    x2  ( 2013 and 2016)  medically manageed  . History of kidney stones   . OSA (obstructive sleep apnea)    per pt study done 2008 approx., told it was moderate osa used cpap until 2 yrs ago after losing wt.  . Recent upper respiratory tract infection    per pt bronchitis finished antibiotic wk of 01-08-2017 ,  states residual  non-productive cough    There are no active problems to display for this patient.   Past Surgical History:  Procedure Laterality Date  . CYSTOSCOPY/RETROGRADE/URETEROSCOPY/STONE EXTRACTION WITH BASKET  2005  approx.  Marland Kitchen KNEE ARTHROSCOPY W/ MENISCECTOMY Right 2015  . TONSILLECTOMY AND ADENOIDECTOMY  child  . VASECTOMY Bilateral 01/17/2017   Procedure: VASECTOMY;  Surgeon: Carolan Clines, MD;  Location: [facility];  Service: Urology;  Laterality: Bilateral;        Home Medications    Prior to Admission medications   Medication Sig Start Date End Date Taking? Authorizing Provider  cetirizine (ZYRTEC) 10 MG tablet  Take 5 mg by mouth daily.    [provider]  Omega-3 Fatty Acids (FISH OIL) 1000 MG CAPS Take 1 capsule by mouth daily.    [provider]  ondansetron (ZOFRAN) 4 MG tablet Take 1 tablet (4 mg total) by mouth 4 (four) times daily as needed for nausea (may repeat dose in 1 hr if no improvement). 01/17/17   Carolan Clines, MD  polycarbophil (FIBERCON) 625 MG tablet  Take 625 mg by mouth daily.    [provider]  Probiotic Product (PROBIOTIC DAILY) CAPS Take 1 capsule by mouth daily.    [provider]  Red Yeast Rice Extract (RED YEAST RICE PO) Take 2 capsules by mouth daily.    [provider]  traMADol-acetaminophen (ULTRACET) 37.5-325 MG tablet Take 1 tablet by mouth every 6 (six) hours as needed for severe pain. 01/27/17   Carolan Clines, MD    Family History No family history on file.  Social History Social History   Tobacco Use  . Smoking status: Never Smoker  . Smokeless tobacco: Never Used  Substance Use Topics  . Alcohol use: Yes    Alcohol/week: 2.4 oz    Types: 2 Cans of beer, 2 Glasses of wine per week  . Drug use: No     Allergies   Ciprofloxacin; Codeine; and Hydrocodone   Review of Systems Review of Systems  Constitutional: Positive for fever. Negative for chills.  HENT: Negative for rhinorrhea and sore throat.   Respiratory: Negative for cough and shortness of breath.   Cardiovascular: Negative for chest pain.  Gastrointestinal: Positive for abdominal pain. Negative for blood in stool, constipation, diarrhea, nausea, rectal pain and vomiting.  Genitourinary: Positive for dysuria, flank pain, frequency, hematuria and urgency. Negative for discharge, scrotal swelling and testicular pain.       +malodorous urine  Musculoskeletal: Negative for arthralgias and myalgias.  Skin: Negative for color change.  Allergic/Immunologic: Negative for immunocompromised state.  Neurological: Negative for weakness and numbness.  Psychiatric/Behavioral: Negative for confusion.   All other systems reviewed and are negative for acute change except as noted in the HPI.    Physical Exam Updated Vital Signs BP (!) 144/89 (BP Location: Right Arm)   Pulse (!) 110   Temp 99.2 F (37.3 C) (Oral)   Resp 18   SpO2 97%   Physical Exam  Constitutional: He is oriented to person, place, and time. Vital signs are  normal. He appears well-developed and well-nourished.  Non-toxic appearance. No distress.  Low grade temp 99.2 but cheeks look rosey so could have slightly higher temp than this; however appears overall well and not septic appearing, nontoxic, NAD  HENT:  Head: Normocephalic and atraumatic.  Mouth/Throat: Oropharynx is clear and moist and mucous membranes are normal.  Eyes: Conjunctivae and EOM are normal. Right eye exhibits no discharge. Left eye exhibits no discharge.  Neck: Normal range of motion. Neck supple.  Cardiovascular: Regular rhythm, normal heart sounds and intact distal pulses. Tachycardia present. Exam reveals no gallop and no friction rub.  No murmur heard. Mildly tachycardic in the 110s  Pulmonary/Chest: Effort normal and breath sounds normal. No respiratory distress. He has no decreased breath sounds. He has no wheezes. He has no rhonchi. He has no rales.  Abdominal: Soft. Normal appearance and bowel sounds are normal. He exhibits no distension. There is tenderness in the suprapubic area and left lower quadrant. There is CVA tenderness. There is no rigidity, no rebound, no guarding, no tenderness at  McBurney's point and negative Murphy's sign.    Soft, nondistended, +BS throughout, with mild LLQ and suprapubic TTP tracking towards the L lateral abdomen towards the L flank area, no r/g/r, neg murphy's, neg mcburney's, +mild L sided CVA TTP   Musculoskeletal: Normal range of motion.  Neurological: He is alert and oriented to person, place, and time. He has normal strength. No sensory deficit.  Skin: Skin is warm, dry and intact. No rash noted.  Psychiatric: He has a normal mood and affect.  Nursing note and vitals reviewed.    ED Treatments / Results  Labs (all labs ordered are listed, but only abnormal results are displayed) Labs Reviewed  URINALYSIS, ROUTINE W REFLEX MICROSCOPIC - Abnormal; Notable for the following components:      Result Value   Color, Urine AMBER (*)     APPearance CLOUDY (*)    Specific Gravity, Urine 1.003 (*)    Hgb urine dipstick LARGE (*)    Nitrite POSITIVE (*)    Leukocytes, UA LARGE (*)    WBC, UA >50 (*)    Bacteria, UA MANY (*)    All other components within normal limits  BASIC METABOLIC PANEL - Abnormal; Notable for the following components:   Sodium 134 (*)    Glucose, Bld 128 (*)    Creatinine, Ser 1.34 (*)    All other components within normal limits  CBC - Abnormal; Notable for the following components:   WBC 13.1 (*)    All other components within normal limits  DIFFERENTIAL - Abnormal; Notable for the following components:   Neutro Abs 10.0 (*)    Monocytes Absolute 1.1 (*)    All other components within normal limits  URINE CULTURE  I-STAT CG4 LACTIC ACID, ED    EKG None  Radiology Ct Renal Stone Study  Result Date: 05/03/2018 CLINICAL DATA:  Left-sided flank pain for several days EXAM: CT ABDOMEN AND PELVIS WITHOUT CONTRAST TECHNIQUE: Multidetector CT imaging of the abdomen and pelvis was performed following the standard protocol without IV contrast. COMPARISON:  07/31/2015 FINDINGS: Lower chest: No acute abnormality. Hepatobiliary: No focal liver abnormality is seen. No gallstones, gallbladder wall thickening, or biliary dilatation. Pancreas: Unremarkable. No pancreatic ductal dilatation or surrounding inflammatory changes. Spleen: Normal in size without focal abnormality. Adrenals/Urinary Tract: Adrenal glands are within normal limits. Kidneys are well visualized bilaterally. Very minimal fullness of the collecting systems is noted in a symmetrical fashion bilaterally. No obstructing stones are seen. Very mild periureteral inflammatory changes noted likely related to an underlying urinary tract infection. The bladder is partially distended. Stomach/Bowel: Scattered diverticular changes noted without evidence of diverticulitis. The appendix is within normal limits. No obstructive changes are seen.  Vascular/Lymphatic: No significant vascular findings are present. No enlarged abdominal or pelvic lymph nodes. Reproductive: Prostate is unremarkable. Other: No abdominal wall hernia or abnormality. No abdominopelvic ascites. Musculoskeletal: No acute or significant osseous findings. IMPRESSION: Mild prominence of the collecting systems bilaterally without obstructing lesions. Mild inflammatory change surrounding the ureters is noted likely related to underlying urinary tract infection. Diverticulosis without diverticulitis. Electronically Signed   By: Inez Catalina M.D.   On: 05/03/2018 21:05    Procedures Procedures (including critical care time)  Medications Ordered in ED Medications  cefTRIAXone (ROCEPHIN) 1 g in sodium chloride 0.9 % 100 mL IVPB (1 g Intravenous New Bag/Given 05/03/18 2234)  acetaminophen (TYLENOL) tablet 1,000 mg (1,000 mg Oral Given 05/03/18 2124)  sodium chloride 0.9 % bolus 1,000 mL (1,000 mLs Intravenous  New Bag/Given 05/03/18 2130)     Initial Impression / Assessment and Plan / ED Course  I have reviewed the triage vital signs and the nursing notes.  Pertinent labs & imaging results that were available during my care of the patient were reviewed by me and considered in my medical decision making (see chart for details).     52 y.o. male here with 4-5 days of dysuria and UTI symptoms, then around 1pm started having L flank pain, had bactrim called in by teledoc provider, took one dose, but then spiked a fever of 101.5 so called urologist who advised him to come to the ED. On exam, mild LLQ TTP tracking towards the L flank area, mild L CVA TTP, but nonperitoneal. Low grade temp 99.2 but cheeks look rosey so likely has slightly higher temp; mildly tachycardic in the 110s, probably from fever/pain. However, despite this, he looks overall well and doesn't appear septic at all. Work up thus far reveals: BMP with mildly elevated Cr 1.34 (unknown baseline); CBC with mild  leukocytosis 13.1, will add-on differential, but remainder of CBC WNL; U/A with cloudy amber urine with large hgb, +nitrites (could be from pigment interference), +large leuks, 6-10 RBCs, >50 WBCs, many bacteria, and WBC clumps present, all of which is consistent with UTI. UCx already in process. DDx includes UTI/pyelo vs kidney stone with superimposed infection vs diverticulitis vs prostatitis, etc. He has no testicular symptoms and no perineal/rectal pain. Will get lactic, differential, and CT renal study to evaluate for kidney stone/diverticulitis/prostatitis/etc. Will give fluids; pt declining wanting anything for pain aside from tylenol, will give this now. Will reassess shortly.   10:38 PM Lactic negative. Differential showing slight neutrophilic predominance. CT renal with mild prominence of collecting systems bilaterally without obstructing lesions, mild inflammatory changes surrounding ureters related to UTI; diverticulosis without diverticulitis; prostate unremarkable. Overall pt well appearing, HR/temp improving with hydration/tylenol, pt feeling well and tolerating PO well here; symptoms and lab/imaging findings c/w pyelonephritis/UTI, but doubt need for admission at this time given overall good clinical appearance and otherwise uncomplicated pt. Will give dose of rocephin, and have him continue his bactrim x14 days since he can't take fluoroquinolones, and bactrim would be an appropriate treatment option for this infection. Will give rx for 4 day supply, since his original rx was only for 10 days. Advised tylenol/motrin for pain/fever, adequate hydration, and f/up with urology in 5-7 days for recheck of symptoms. Strict return precautions advised. Discussed case with my attending Dr. Zenia Resides who agrees with plan. I explained the diagnosis and have given explicit precautions to return to the ER including for any other new or worsening symptoms. The patient understands and accepts the medical plan as  it's been dictated and I have answered their questions. Discharge instructions concerning home care and prescriptions have been given. The patient is STABLE and will be discharged to home in good condition once his rocephin is done.    Final Clinical Impressions(s) / ED Diagnoses   Final diagnoses:  Pyelonephritis  Acute UTI  Left flank pain  AKI (acute kidney injury) ([place])  Neutrophilic leukocytosis    ED Discharge Orders        Ordered    sulfamethoxazole-trimethoprim (BACTRIM DS,SEPTRA DS) 800-160 MG tablet  2 times daily     05/03/18 [address], [place], [place] 05/03/18 2241    Lacretia Leigh, MD 05/03/18 2252

## 2018-05-03 NOTE — Discharge Instructions (Addendum)
Stay very well hydrated with plenty of water throughout the day. Take antibiotic until completed (taking your original prescription until it's finished, then taking the prescription given today until finished). Alternate between tylenol and motrin as needed for pain or fever. Follow up with the urologist in 5-7 days for recheck of ongoing symptoms but return to [facility] for emergent changing or worsening of symptoms. Please seek immediate care if you develop the following: You develop worsening back pain.  Your symptoms are no better, or worse in 3 days. There is severe back pain or lower abdominal pain.  You develop chills.  You have a persistent fever that isn't coming down with tylenol/motrin There is nausea or vomiting.  There is continued burning or discomfort with urination.

## 2018-05-03 NOTE — ED Triage Notes (Signed)
Pt complains of left flank pain, dysuria x 4 days. Pt had Bactrim called in this morning, has taken 1 dose. Pt then had a fever of 101.5, called [facility] and was told to come to ED.

## 2018-05-05 DIAGNOSIS — N3000 Acute cystitis without hematuria: Secondary | ICD-10-CM | POA: Diagnosis not present

## 2018-05-06 LAB — URINE CULTURE

## 2018-05-07 ENCOUNTER — Encounter: Payer: Self-pay | Admitting: Cardiovascular Disease

## 2018-05-07 ENCOUNTER — Telehealth: Payer: Self-pay | Admitting: *Deleted

## 2018-05-07 NOTE — Telephone Encounter (Signed)
Post ED Visit - Positive Culture Follow-up  Culture report reviewed by antimicrobial stewardship pharmacist:  []  Raymond Parsons, Pharm.D. []  Raymond Parsons, Pharm.D., BCPS AQ-ID []  Raymond Parsons, Pharm.D., BCPS []  Raymond Parsons, Pharm.D., BCPS []  Raymond Parsons, Pharm.D., BCPS, AAHIVP []  Raymond Parsons, Pharm.D., BCPS, AAHIVP []  Raymond Parsons, PharmD, BCPS []  Raymond Parsons, PharmD []  Raymond Parsons, PharmD, BCPS Raymond Parsons, PharmD  Positive urine culture Treated with Sulfamethoxazole-Trimethoprim, organism sensitive to the same and no further patient follow-up is required at this time.  [name] 05/07/2018, 11:01 AM

## 2018-05-13 ENCOUNTER — Ambulatory Visit: Payer: 59 | Admitting: Cardiovascular Disease

## 2018-05-22 DIAGNOSIS — Z0189 Encounter for other specified special examinations: Secondary | ICD-10-CM | POA: Diagnosis not present

## 2018-05-22 DIAGNOSIS — R931 Abnormal findings on diagnostic imaging of heart and coronary circulation: Secondary | ICD-10-CM | POA: Diagnosis not present

## 2018-05-27 DIAGNOSIS — R062 Wheezing: Secondary | ICD-10-CM | POA: Diagnosis not present

## 2018-05-27 DIAGNOSIS — R05 Cough: Secondary | ICD-10-CM | POA: Diagnosis not present

## 2018-05-27 DIAGNOSIS — J302 Other seasonal allergic rhinitis: Secondary | ICD-10-CM | POA: Diagnosis not present

## 2018-06-21 DIAGNOSIS — R05 Cough: Secondary | ICD-10-CM | POA: Diagnosis not present

## 2018-07-27 ENCOUNTER — Encounter

## 2018-07-27 ENCOUNTER — Ambulatory Visit (INDEPENDENT_AMBULATORY_CARE_PROVIDER_SITE_OTHER): Payer: 59 | Admitting: Cardiovascular Disease

## 2018-07-27 ENCOUNTER — Encounter (INDEPENDENT_AMBULATORY_CARE_PROVIDER_SITE_OTHER): Payer: Self-pay

## 2018-07-27 ENCOUNTER — Encounter: Payer: Self-pay | Admitting: Cardiovascular Disease

## 2018-07-27 VITALS — BP 142/90 | HR 81 | Ht 70.0 in | Wt 213.0 lb

## 2018-07-27 DIAGNOSIS — I2510 Atherosclerotic heart disease of native coronary artery without angina pectoris: Secondary | ICD-10-CM | POA: Diagnosis not present

## 2018-07-27 DIAGNOSIS — Z9189 Other specified personal risk factors, not elsewhere classified: Secondary | ICD-10-CM

## 2018-07-27 DIAGNOSIS — E785 Hyperlipidemia, unspecified: Secondary | ICD-10-CM | POA: Diagnosis not present

## 2018-07-27 NOTE — Patient Instructions (Addendum)
Medication Instructions: Dr Sallyanne Kuster has recommended making the following medication changes: 1. STOP Fenofibrate  Labwork: Your physician recommends that you return for lab work in 3 months - FASTING. Okay to have completed at your primary care phyisician's office.  Testing/Procedures: 1. Exercise Treadmill Stress test - Your physician has requested that you have an exercise tolerance test. For further information please visit [email or web address]. Please also follow instruction sheet, as given.  Follow-up: Dr Sallyanne Kuster recommends that you schedule a follow-up appointment in 12 months. You will receive a reminder letter in the mail two months in advance. If you don't receive a letter, please call our office to schedule the follow-up appointment.  If you need a refill on your cardiac medications before your next appointment, please call your pharmacy.   target waistline = 34 inches; target weight = 200 pounds  by this time next year

## 2018-07-28 NOTE — Progress Notes (Signed)
Cardiology Office Note:    Date:  07/29/2018   ID:  Raymond Parsons, DOB December 18, 1965, MRN [id number]  PCP:  Velna Hatchet, MD  Cardiologist:  No primary care provider on file.   Referring MD: Velna Hatchet, MD   Chief Complaint  Patient presents with  . Follow-up  . Tachycardia  Discuss coronary calcium score  History of Present Illness:    Raymond Parsons is a 52 y.o. male here to discuss the findings on a recent coronary calcium score.  His score was 147 which places him in the 92nd percentile for age/gender.  His additional risk factors include male gender, mild obesity, family history of CAD (albeit not premature), hypertriglyceridemia.  He does not smoke and does not have diabetes mellitus.  He does not have any history of coronary disease, PAD, stroke/TIA or other complications of atherosclerosis.  He was advised to start taking a statin, but has been reluctant.  He is already taking fenofibrate 145 mg daily for hypertriglyceridemia.  Abs performed in May showed triglycerides of 261 (he had not yet started fenofibrate), total cholesterol 221, HDL 34, LDL 135.  Liver function tests, fasting glucose, creatinine and electrolytes were all normal.  His hemoglobin A1c was 5.4%.  The patient specifically denies any chest pain at rest exertion, dyspnea at rest or with exertion, orthopnea, paroxysmal nocturnal dyspnea, syncope, palpitations, focal neurological deficits, intermittent claudication, lower extremity edema, unexplained weight gain, cough, hemoptysis or wheezing.   Past Medical History:  Diagnosis Date  . History of colonic diverticulitis    x2  ( 2013 and 2016)  medically manageed  . History of kidney stones   . OSA (obstructive sleep apnea)    per pt study done 2008 approx., told it was moderate osa used cpap until 2 yrs ago after losing wt.  . Recent upper respiratory tract infection    per pt bronchitis finished antibiotic wk of 01-08-2017 ,  states residual   non-productive cough    Past Surgical History:  Procedure Laterality Date  . CYSTOSCOPY/RETROGRADE/URETEROSCOPY/STONE EXTRACTION WITH BASKET  2000  . KNEE ARTHROSCOPY W/ MENISCECTOMY Right 2015  . TONSILLECTOMY AND ADENOIDECTOMY  1978  . VASECTOMY Bilateral 01/17/2017   Procedure: VASECTOMY;  Surgeon: Carolan Clines, MD;  Location: [facility];  Service: Urology;  Laterality: Bilateral;    Current Medications: Current Meds  Medication Sig  . aspirin EC 81 MG tablet Take 81 mg by mouth daily.  . fenofibrate (TRICOR) 145 MG tablet Take 145 mg by mouth at bedtime.  . fluticasone (FLONASE) 50 MCG/ACT nasal spray Place 2 sprays into both nostrils 2 (two) times daily.  Marland Kitchen loratadine (CLARITIN) 10 MG tablet Take 10 mg by mouth daily.  . Omega-3 Fatty Acids (FISH OIL) 1000 MG CAPS Take 1 capsule by mouth daily.  . polycarbophil (FIBERCON) 625 MG tablet Take 625 mg by mouth daily.  . Probiotic Product (PROBIOTIC DAILY) CAPS Take 1 capsule by mouth daily.  . rosuvastatin (CRESTOR) 10 MG tablet Take 10 mg by mouth daily.  . [DISCONTINUED] ibuprofen (ADVIL,MOTRIN) 200 MG tablet Take 400 mg by mouth every 6 (six) hours as needed for moderate pain.     Allergies:   Ciprofloxacin; Codeine; and Hydrocodone   Social History   Socioeconomic History  . Marital status: Married    Spouse name: Not on file  . Number of children: Not on file  . Years of education: Not on file  . Highest education level: Not on file  Occupational History  .  Not on file  Social Needs  . Financial resource strain: Not on file  . Food insecurity:    Worry: Not on file    Inability: Not on file  . Transportation needs:    Medical: Not on file    Non-medical: Not on file  Tobacco Use  . Smoking status: Former Research scientist (life sciences)  . Smokeless tobacco: Never Used  Substance and Sexual Activity  . Alcohol use: Yes    Alcohol/week: 4.0 standard drinks    Types: 2 Glasses of wine, 2 Cans of beer per week  .  Drug use: No  . Sexual activity: Not on file  Lifestyle  . Physical activity:    Days per week: Not on file    Minutes per session: Not on file  . Stress: Not on file  Relationships  . Social connections:    Talks on phone: Not on file    Gets together: Not on file    Attends religious service: Not on file    Active member of club or organization: Not on file    Attends meetings of clubs or organizations: Not on file    Relationship status: Not on file  Other Topics Concern  . Not on file  Social History Narrative  . Not on file     Family History: The patient's family history includes Arrhythmia in his mother; Heart attack (age of onset: 3) in his paternal grandfather; Hyperlipidemia in his father, sister, and sister.  ROS:   Please see the history of present illness.     All other systems reviewed and are negative.  EKGs/Labs/Other Studies Reviewed:    The following studies were reviewed today: Coronary calcium score, labs  EKG:  EKG is  ordered today.  The ekg ordered today demonstrates normal sinus rhythm, normal tracing  Recent Labs: 05/03/2018: BUN 20; Creatinine, Ser 1.34; Hemoglobin 14.3; Platelets 209; Potassium 3.9; Sodium 134  Globin A1c 5.4% Recent Lipid Panel Total cholesterol 221, triglycerides 261, HDL 34, LDL 135  Physical Exam:    VS:  BP (!) 142/90 (BP Location: Left Arm, Patient Position: Sitting, Cuff Size: Normal)   Pulse 81   Ht 5\' 10"  (1.778 m)   Wt 213 lb (96.6 kg)   BMI 30.56 kg/m     Wt Readings from Last 3 Encounters:  07/27/18 213 lb (96.6 kg)  01/17/17 205 lb (93 kg)     GEN: Mildly obese.  He appears fairly strong and fit, but has prominent ventral adiposity.  Well nourished, well developed in no acute distress HEENT: Normal NECK: No JVD; No carotid bruits LYMPHATICS: No lymphadenopathy CARDIAC: RRR, no murmurs, rubs, gallops RESPIRATORY:  Clear to auscultation without rales, wheezing or rhonchi  ABDOMEN: Soft, non-tender,  non-distended MUSCULOSKELETAL:  No edema; No deformity  SKIN: Warm and dry NEUROLOGIC:  Alert and oriented x 3 PSYCHIATRIC:  Normal affect   ASSESSMENT:    1. Dyslipidemia   2. Coronary artery calcification seen on CT scan   3. Multiple risk factors for coronary artery disease    PLAN:    In order of problems listed above:  1. HLP: Unfortunately, Donyel has numerous features to suggest an adverse metabolic profile consistent with insulin resistance/metabolic syndrome.  His low HDL and elevated triglycerides, together with his ventral adiposity suggest that he has insulin resistance and is likely to have small dense LDL cholesterol particles.  I do not think we need to do a specialized lipid profile to confirm this.  Thankfully he  does not have diabetes mellitus or even borderline hyperglycemia.  We discussed the need for weight loss and regular physical exercise as mandatory steps to improve his lipid profile and reduce his future risk of coronary events. 2. Coronary calcification: Based on his calcium score/MESA his 10-year risk of major adverse cardiovascular events is 9.3%.  This justifies initiation of statin therapy.  In fact, I think statin therapy would be highly beneficial compared with fiber therapy.  His triglycerides were never high enough to put him at risk for pancreatitis and or a secondary vascular risk factor.  In fact, I think with weight loss and physical activity he can easily bring his triglycerides back to less than 150, hopefully accompanied by an increase in HDL.  Recommend statin therapy to bring his LDL less than 100, preferably less than 70.  It is perfectly reasonable at the age of 28 with his current coronary calcium profile for him to have a baseline treadmill stress test to make sure we are not dealing with significant coronary stenoses that are not yet clinically evident.   Medication Adjustments/Labs and Tests Ordered: Current medicines are reviewed at length with  the patient today.  Concerns regarding medicines are outlined above.  Orders Placed This Encounter  Procedures  . Lipid panel  . EXERCISE TOLERANCE TEST (ETT)  . EKG 12-Lead   No orders of the defined types were placed in this encounter.   Patient Instructions  Medication Instructions: Dr Sallyanne Kuster has recommended making the following medication changes: 1. STOP Fenofibrate  Labwork: Your physician recommends that you return for lab work in 3 months - FASTING. Okay to have completed at your primary care phyisician's office.  Testing/Procedures: 1. Exercise Treadmill Stress test - Your physician has requested that you have an exercise tolerance test. For further information please visit [email or web address]. Please also follow instruction sheet, as given.  Follow-up: Dr Sallyanne Kuster recommends that you schedule a follow-up appointment in 12 months. You will receive a reminder letter in the mail two months in advance. If you don't receive a letter, please call our office to schedule the follow-up appointment.  If you need a refill on your cardiac medications before your next appointment, please call your pharmacy.   target waistline = 34 inches; target weight = 200 pounds  by this time next year    Signed, Sanda Klein, MD  07/29/2018 6:04 PM    [place]

## 2018-07-29 DIAGNOSIS — I2510 Atherosclerotic heart disease of native coronary artery without angina pectoris: Secondary | ICD-10-CM | POA: Insufficient documentation

## 2018-07-29 DIAGNOSIS — E785 Hyperlipidemia, unspecified: Secondary | ICD-10-CM | POA: Insufficient documentation

## 2018-08-18 DIAGNOSIS — Z1211 Encounter for screening for malignant neoplasm of colon: Secondary | ICD-10-CM | POA: Diagnosis not present

## 2018-09-15 ENCOUNTER — Telehealth (HOSPITAL_COMMUNITY): Payer: Self-pay

## 2018-09-15 NOTE — Telephone Encounter (Signed)
Encounter complete. 

## 2018-09-17 ENCOUNTER — Ambulatory Visit (HOSPITAL_COMMUNITY)
Admission: RE | Admit: 2018-09-17 | Discharge: 2018-09-17 | Disposition: A | Discharge status: Home / Self Care | Payer: 59 | Source: Ambulatory Visit | Attending: Cardiology | Admitting: Cardiology

## 2018-09-17 DIAGNOSIS — Z9189 Other specified personal risk factors, not elsewhere classified: Secondary | ICD-10-CM | POA: Diagnosis not present

## 2018-09-18 LAB — EXERCISE TOLERANCE TEST
CHL CUP MPHR: 169 {beats}/min
CSEPEW: 13.7 METS
CSEPHR: 101 %
Exercise duration (min): 12 min
Exercise duration (sec): 2 s
Peak HR: 171 {beats}/min
RPE: 17
Rest HR: 81 {beats}/min

## 2018-10-14 DIAGNOSIS — K5730 Diverticulosis of large intestine without perforation or abscess without bleeding: Secondary | ICD-10-CM | POA: Diagnosis not present

## 2018-10-14 DIAGNOSIS — D126 Benign neoplasm of colon, unspecified: Secondary | ICD-10-CM | POA: Diagnosis not present

## 2018-10-14 DIAGNOSIS — Z1211 Encounter for screening for malignant neoplasm of colon: Secondary | ICD-10-CM | POA: Diagnosis not present

## 2018-11-17 DIAGNOSIS — Z7189 Other specified counseling: Secondary | ICD-10-CM | POA: Diagnosis not present

## 2018-11-17 DIAGNOSIS — Z6831 Body mass index (BMI) 31.0-31.9, adult: Secondary | ICD-10-CM | POA: Diagnosis not present

## 2018-11-19 IMAGING — CT CT RENAL STONE PROTOCOL
2 of 4 series · 16 of 46 positions shown, 18 images · non-contrast
Comparison: 07/31/2015

CLINICAL DATA: Left-sided flank pain for several days

EXAM:
CT ABDOMEN AND PELVIS WITHOUT CONTRAST
TECHNIQUE: Multidetector CT imaging of the abdomen and pelvis was performed
following the standard protocol without IV contrast.

[Series 2: axial st · axial · 0.92mm/px · z∈[+1010,+1475]mm · 13 of 105 slices shown, 15 images]
[im 6/105  soft-tissue]
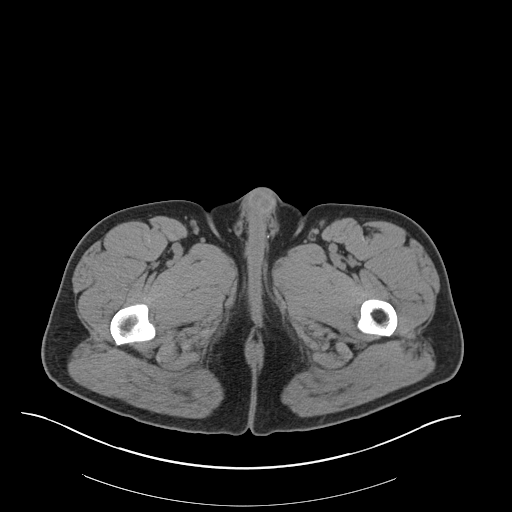
[im 6/105  bone]
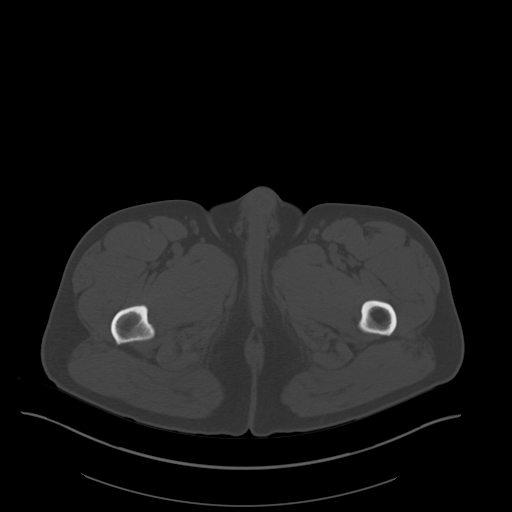
[im 16/105  soft-tissue]
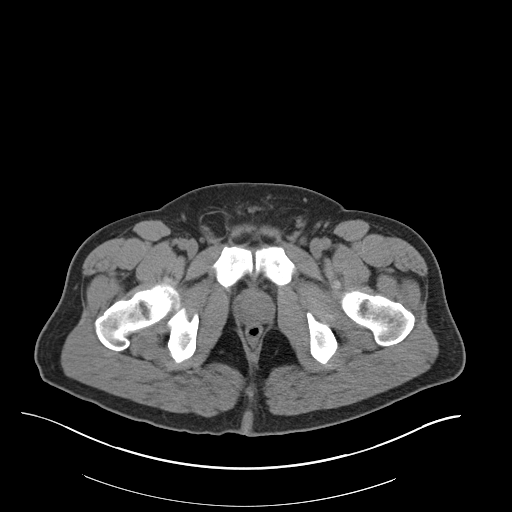
[im 21/105  soft-tissue]
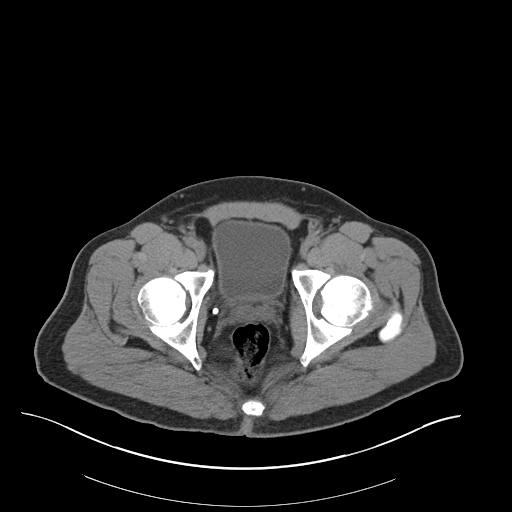
[im 32/105  soft-tissue]
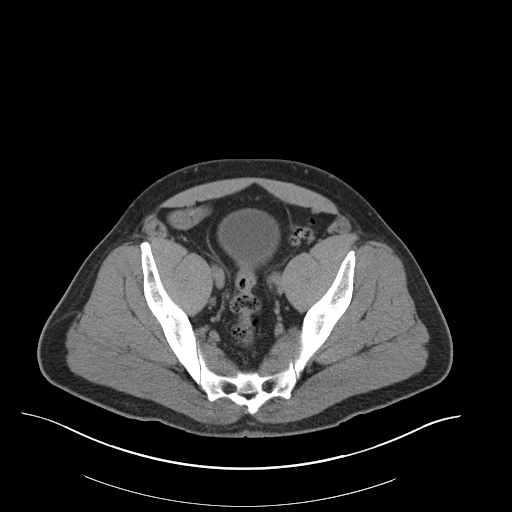
[im 37/105  soft-tissue]
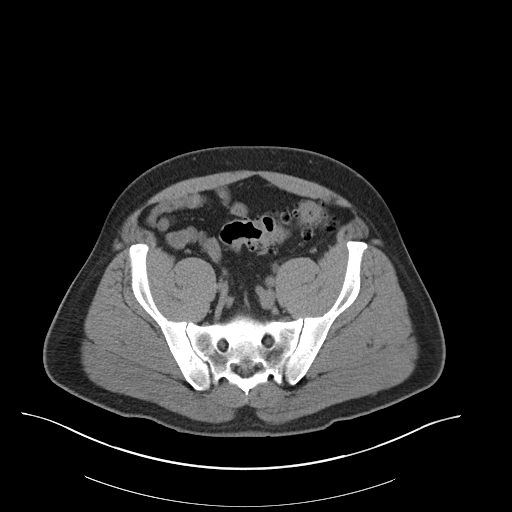
[im 47/105  soft-tissue]
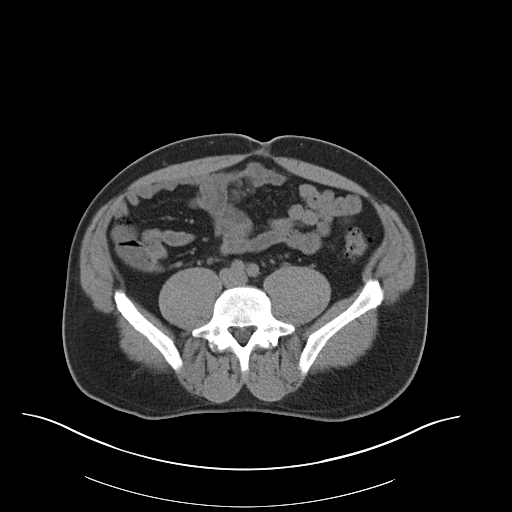
[im 53/105  soft-tissue]
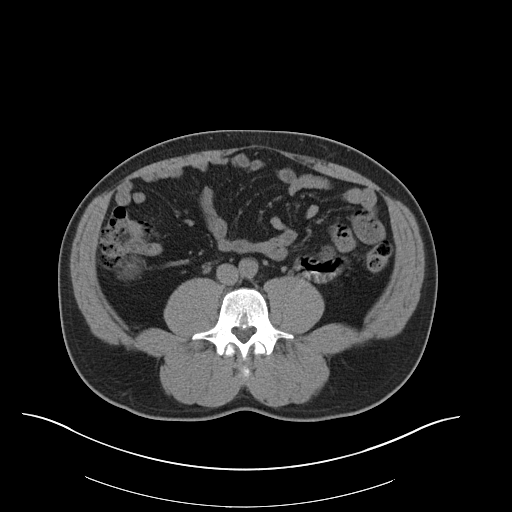
[im 58/105  soft-tissue]
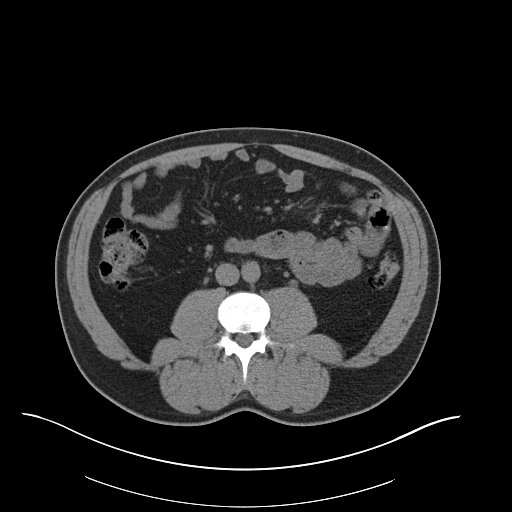
[im 68/105  soft-tissue]
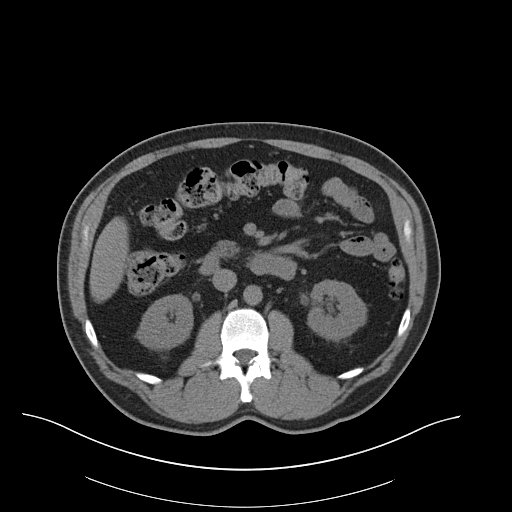
[im 68/105  bone]
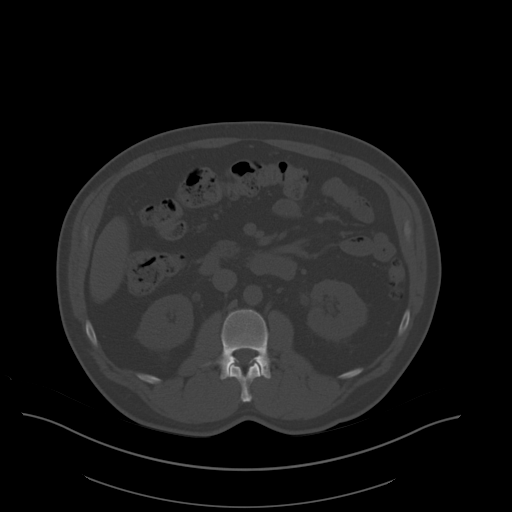
[im 73/105  soft-tissue]
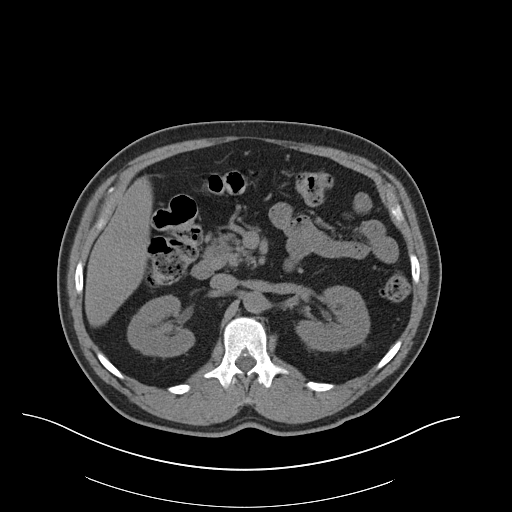
[im 84/105  soft-tissue]
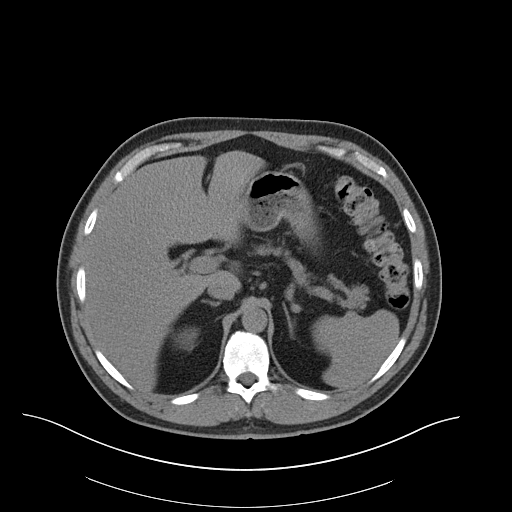
[im 89/105  soft-tissue]
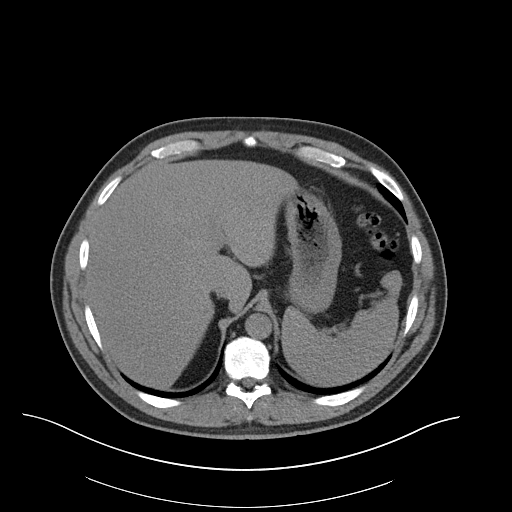
[im 99/105  soft-tissue]
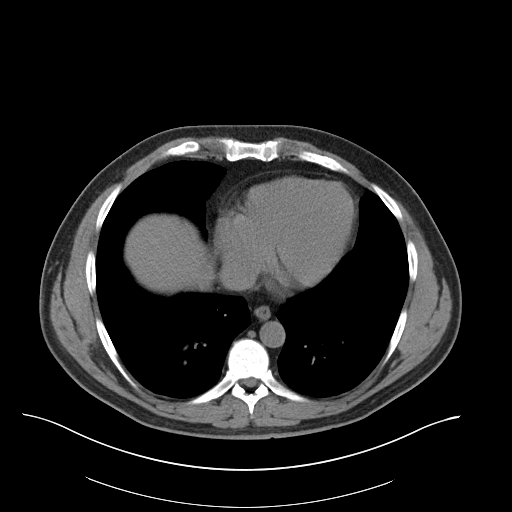

[Series 4: coronal · coronal · 0.88mm/px · 3 of 151 slices shown]
[im 51/151  soft-tissue]
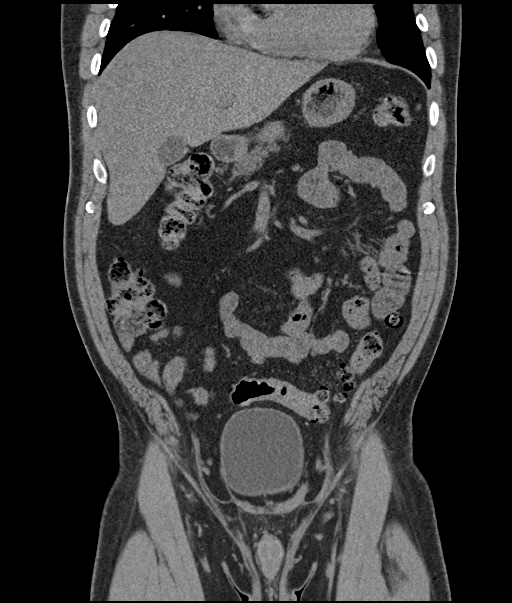
[im 67/151  soft-tissue]
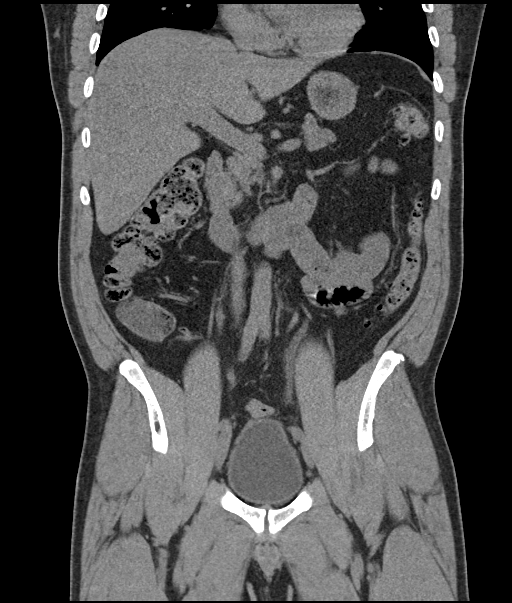
[im 84/151  soft-tissue]
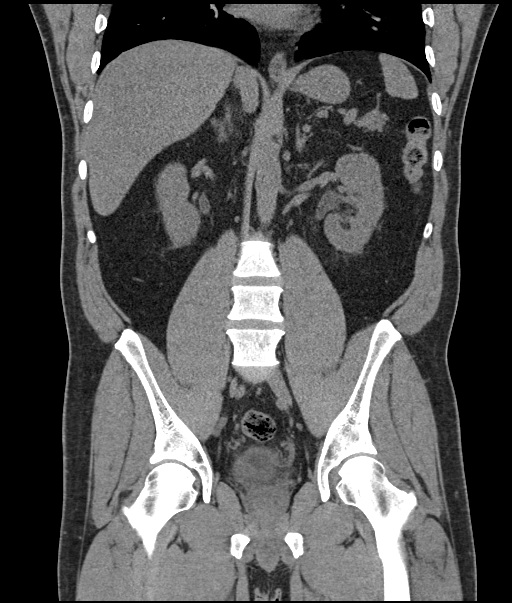

[16 of 46 positions shown; findings below may reference images not displayed]

FINDINGS: Lower chest: No acute abnormality.

Hepatobiliary: No focal liver abnormality is seen. No gallstones,
gallbladder wall thickening, or biliary dilatation.

Pancreas: Unremarkable. No pancreatic ductal dilatation or
surrounding inflammatory changes.

Spleen: Normal in size without focal abnormality.

Adrenals/Urinary Tract: Adrenal glands are within normal limits.
Kidneys are well visualized bilaterally. Very minimal fullness of
the collecting systems is noted in a symmetrical fashion
bilaterally. No obstructing stones are seen. Very mild periureteral
inflammatory changes noted likely related to an underlying urinary
tract infection. The bladder is partially distended.

Stomach/Bowel: Scattered diverticular changes noted without evidence
of diverticulitis. The appendix is within normal limits. No
obstructive changes are seen.

Vascular/Lymphatic: No significant vascular findings are present. No
enlarged abdominal or pelvic lymph nodes.

Reproductive: Prostate is unremarkable.

Other: No abdominal wall hernia or abnormality. No abdominopelvic
ascites.

Musculoskeletal: No acute or significant osseous findings.
IMPRESSION: Mild prominence of the collecting systems bilaterally without
obstructing lesions. Mild inflammatory change surrounding the
ureters is noted likely related to underlying urinary tract
infection.

Diverticulosis without diverticulitis.

## 2019-04-12 DIAGNOSIS — E7849 Other hyperlipidemia: Secondary | ICD-10-CM | POA: Diagnosis not present

## 2019-04-12 DIAGNOSIS — R82998 Other abnormal findings in urine: Secondary | ICD-10-CM | POA: Diagnosis not present

## 2019-05-03 ENCOUNTER — Telehealth: Payer: Self-pay

## 2019-05-03 ENCOUNTER — Telehealth: Payer: Self-pay | Admitting: Cardiovascular Disease

## 2019-05-03 NOTE — Telephone Encounter (Signed)
Pt verbalized understanding of Dr. Victorino December recommendations and will keep track of his BP under those circumstances and record them for his appt 05/13/19 but will call sooner if his numbers are consistently running high.

## 2019-05-03 NOTE — Telephone Encounter (Signed)
Pt called to report that he has been having problems with elevated BP.Marland Kitchen  He has been seeing [name] his PCP and he has started him on Losartan 25mg  a day for about a week.. his BP is staying around 14/100 but after he exercised his BP comes down to 120's/ 80's....he does not have list of all of his BP readings but they go between 130/90 and 140/100.Marland Kitchen  Not sure of his HR.Marland Kitchen  He says the Losartan may not be the right dose or med but he would like Dr. Victorino December advice.   Not sure if had bloodwork recently but here last labs 05/2018.   He says he has been very stressed with COVID19 and changes at work.. he notices it affects his BP and at times he has tightness in his chest.. he says it is not related to rest or exertion but when he gets upset it bothers him and he cannot take a deep breath... however.. no SOB, dizziness, or headache.   Will send to Dr. Sallyanne Kuster for review...pt has follow up with Dr Sallyanne Kuster 05/14/19.

## 2019-05-03 NOTE — Telephone Encounter (Signed)
New Message   Patient states he got a phone call and missed the call.  Patient states no voicemail was left all he could here is someone typing in the background.  No note in chart about call.  Please give patient a call back.

## 2019-05-03 NOTE — Telephone Encounter (Signed)
Exercise and emotional stress (a couple of the many things that can quickly alter the blood pressure.  When we talk about treating hypertension, referring to the blood pressure checked in a sitting position, at rest for at least the previous 10 to 15 minutes, when relaxed and calm. It is true that the dose of losartan 25 mg is the lowest starting dose and that he might need additional adjustment, but I would wait for another week before making that call.  Please have him check the blood pressure in the conditions described above once a day and send [place] a log in a week.  Will make decisions regarding adjustments to his blood pressure medicine based on that information.

## 2019-05-03 NOTE — Telephone Encounter (Addendum)
° ° °  Patient calling with BP concerns.  Pt c/o BP issue: STAT if pt c/o blurred vision, one-sided weakness or slurred speech  1. What are your last 5 BP readings? average 140/100  2. Are you having any other symptoms (ex. Dizziness, headache, blurred vision, passed out)? Chest felt  Tight over 1 week ago  3. What is your BP issue? Patient feels BP too high, patient started on BP med recently  by Dr Ardeth Perfect but he did not remember the name of medication

## 2019-05-10 ENCOUNTER — Telehealth: Payer: Self-pay | Admitting: Cardiovascular Disease

## 2019-05-10 NOTE — Telephone Encounter (Signed)
Smartphone/ consent/ my chart via emailed/ pre reg completed

## 2019-05-14 ENCOUNTER — Encounter: Payer: Self-pay | Admitting: Cardiovascular Disease

## 2019-05-14 ENCOUNTER — Telehealth (INDEPENDENT_AMBULATORY_CARE_PROVIDER_SITE_OTHER): Payer: 59 | Admitting: Cardiovascular Disease

## 2019-05-14 DIAGNOSIS — E785 Hyperlipidemia, unspecified: Secondary | ICD-10-CM

## 2019-05-14 DIAGNOSIS — E663 Overweight: Secondary | ICD-10-CM

## 2019-05-14 DIAGNOSIS — I10 Essential (primary) hypertension: Secondary | ICD-10-CM | POA: Diagnosis not present

## 2019-05-14 DIAGNOSIS — I2510 Atherosclerotic heart disease of native coronary artery without angina pectoris: Secondary | ICD-10-CM

## 2019-05-14 NOTE — Patient Instructions (Signed)
Medication Instructions:  Your physician recommends that you continue on your current medications as directed. Please refer to the Current Medication list given to you today.  If you need a refill on your cardiac medications before your next appointment, please call your pharmacy.   Lab work: None ordered  Testing/Procedures: None ordered  Follow-Up: At [facility], you and your health needs are our priority.  As part of our continuing mission to provide you with exceptional heart care, we have created designated Provider Care Teams.  These Care Teams include your primary Cardiologist (physician) and Advanced Practice Providers (APPs -  Physician Assistants and Nurse Practitioners) who all work together to provide you with the care you need, when you need it. You will need a follow up appointment in 6 months.  Please call our office 2 months in advance to schedule this appointment.  You may see Mihai Croitoru, MD or one of the following Advanced Practice Providers on your designated Care Team: Hao Meng, PA-C Angela Duke, PA-C     

## 2019-05-14 NOTE — Progress Notes (Signed)
Virtual Visit via Video Note   This visit type was conducted due to national recommendations for restrictions regarding the COVID-19 Pandemic (e.g. social distancing) in an effort to limit this patient's exposure and mitigate transmission in our community.  Due to his co-morbid illnesses, this patient is at least at moderate risk for complications without adequate follow up.  This format is felt to be most appropriate for this patient at this time.  All issues noted in this document were discussed and addressed.  A limited physical exam was performed with this format.  Please refer to the patient's chart for his consent to telehealth for [facility].   Date:  05/14/2019   ID:  Raymond Parsons, DOB Sep 11, 1966, MRN [id number]  Patient Location: Home Provider Location: Home  PCP:  Velna Hatchet, MD  Cardiologist:  Daphane Odekirk Electrophysiologist:  None   Evaluation Performed:  Follow-Up Visit  Chief Complaint:  Hyperlipidemia, increased coronary calcium score  History of Present Illness:    Raymond Parsons is a 53 y.o. male with dyslipidemia and increased coronary calcium score returning for follow-up.  In 2019 his coronary calcium score was 147 (92nd percentile).  At that point he was mildly obese and was not taking a statin.  She does not have a personal history of coronary or vascular disease and he had a normal treadmill stress test in October 2019.    He does have a family history of coronary disease (although not at premature ages) and was recently diagnosed with hypertension and started on losartan.  His blood pressure was as high as 140/100 and he felt unwell.  He has also started exercising regularly 5 days a week either 30-minute walk or 30 minutes spin class.  He has lost about 13 pounds.  His blood pressure is now under excellent control.  The patient specifically denies any chest pain at rest exertion, dyspnea at rest or with exertion, orthopnea, paroxysmal nocturnal dyspnea,  syncope, palpitations, focal neurological deficits, intermittent claudication, lower extremity edema, unexplained weight gain, cough, hemoptysis or wheezing.  His most recent lipid profile from last month shows a total cholesterol 176, HDL 36, triglycerides 264, calculated LDL 87, creatinine 1.1, hemoglobin 16, potassium 3.9.  The patient does not have symptoms concerning for COVID-19 infection (fever, chills, cough, or new shortness of breath).    Past Medical History:  Diagnosis Date  . History of colonic diverticulitis    x2  ( 2013 and 2016)  medically manageed  . History of kidney stones   . OSA (obstructive sleep apnea)    per pt study done 2008 approx., told it was moderate osa used cpap until 2 yrs ago after losing wt.  . Recent upper respiratory tract infection    per pt bronchitis finished antibiotic wk of 01-08-2017 ,  states residual  non-productive cough   Past Surgical History:  Procedure Laterality Date  . CYSTOSCOPY/RETROGRADE/URETEROSCOPY/STONE EXTRACTION WITH BASKET  2000  . KNEE ARTHROSCOPY W/ MENISCECTOMY Right 2015  . TONSILLECTOMY AND ADENOIDECTOMY  1978  . VASECTOMY Bilateral 01/17/2017   Procedure: VASECTOMY;  Surgeon: Carolan Clines, MD;  Location: [facility];  Service: Urology;  Laterality: Bilateral;     Current Meds  Medication Sig  . aspirin EC 81 MG tablet Take 81 mg by mouth daily.  . fluticasone (FLONASE) 50 MCG/ACT nasal spray Place 2 sprays into both nostrils 2 (two) times daily.  Marland Kitchen loratadine (CLARITIN) 10 MG tablet Take 10 mg by mouth daily.  Marland Kitchen losartan (COZAAR) 25  MG tablet Take 25 mg by mouth daily.  . Omega-3 Fatty Acids (FISH OIL) 1000 MG CAPS Take 1 capsule by mouth daily.  . polycarbophil (FIBERCON) 625 MG tablet Take 625 mg by mouth daily.  . Probiotic Product (PROBIOTIC DAILY) CAPS Take 1 capsule by mouth daily.  . [DISCONTINUED] rosuvastatin (CRESTOR) 10 MG tablet Take 10 mg by mouth daily.     Allergies:    Ciprofloxacin, Codeine, and Hydrocodone   Social History   Tobacco Use  . Smoking status: Former Research scientist (life sciences)  . Smokeless tobacco: Never Used  Substance Use Topics  . Alcohol use: Yes    Alcohol/week: 4.0 standard drinks    Types: 2 Glasses of wine, 2 Cans of beer per week  . Drug use: No     Family Hx: The patient's family history includes Arrhythmia in his mother; Heart attack (age of onset: 59) in his paternal grandfather; Hyperlipidemia in his father, sister, and sister.  ROS:   Please see the history of present illness.     All other systems reviewed and are negative.   Prior CV studies:   The following studies were reviewed today:  Treadmill stress test October 2019 and coronary calcium score  Labs/Other Tests and Data Reviewed:    EKG:  An ECG dated 07/28/2018 was personally reviewed today and demonstrated:  Normal sinus rhythm, normal tracing  Recent Labs: Apr 12, 2019 [facility] creatinine 1.1, hemoglobin 16, potassium 3.9. Recent Lipid Panel Apr 12, 2019 [facility] total cholesterol 176, HDL 36, triglycerides 264, calculated LDL 87  Wt Readings from Last 3 Encounters:  05/14/19 202 lb (91.6 kg)  07/27/18 213 lb (96.6 kg)  01/17/17 205 lb (93 kg)     Objective:    Vital Signs:  BP 123/75   Pulse 75   Ht 5\' 10"  (1.778 m)   Wt 202 lb (91.6 kg)   BMI 28.98 kg/m    VITAL SIGNS:  reviewed GEN:  no acute distress EYES:  sclerae anicteric, EOMI - Extraocular Movements Intact RESPIRATORY:  normal respiratory effort, symmetric expansion CARDIOVASCULAR:  no peripheral edema SKIN:  no rash, lesions or ulcers. MUSCULOSKELETAL:  no obvious deformities. NEURO:  alert and oriented x 3, no obvious focal deficit PSYCH:  normal affect  ASSESSMENT & PLAN:    1. Dyslipidemia: Has all the typical features of the metabolic syndrome including hypertriglyceridemia, low HDL cholesterol, moderately elevated LDL cholesterol likely with an adverse particle  profile.  Until recently was obese, but now is in the overweight range and has some features suggesting tendency to diabetes mellitus (borderline hemoglobin A1c 5.6%).  His dose of statin was recently increased and he has plans for repeat lipid profile in August.  Discussed the fact that this does not take away the need for improved diet, physical activity on a regular basis and weight loss, which are the methods through which he can reduce his insulin resistance and improve the HDL and triglyceride levels. 2. Increased calcium score: Justifies aggressive lipid lowering strategy.  He does not have any symptoms of coronary insufficiency and he had a normal ECG treadmill test last October. 3. HTN: Markedly improved blood pressure readings on a low dose of losartan.  I think a big part of the improved blood pressure can also be attributed to regular physical activity and weight loss.   COVID-19 Education: The signs and symptoms of COVID-19 were discussed with the patient and how to seek care for testing (follow up with PCP or arrange  E-visit).  The importance of social distancing was discussed today.  Time:   Today, I have spent 16 minutes with the patient with telehealth technology discussing the above problems.     Medication Adjustments/Labs and Tests Ordered: Current medicines are reviewed at length with the patient today.  Concerns regarding medicines are outlined above.   Tests Ordered: No orders of the defined types were placed in this encounter.   Medication Changes: No orders of the defined types were placed in this encounter.   Follow Up:  Virtual Visit or In Person 6 months  Signed, Sanda Klein, MD  05/14/2019 8:54 AM    [place]

## 2019-08-06 ENCOUNTER — Encounter: Payer: Self-pay | Admitting: Cardiovascular Disease

## 2019-08-10 ENCOUNTER — Other Ambulatory Visit: Payer: Self-pay

## 2019-08-10 ENCOUNTER — Ambulatory Visit (INDEPENDENT_AMBULATORY_CARE_PROVIDER_SITE_OTHER): Payer: 59 | Admitting: Cardiovascular Disease

## 2019-08-10 ENCOUNTER — Encounter: Payer: Self-pay | Admitting: Cardiovascular Disease

## 2019-08-10 VITALS — BP 132/80 | HR 73 | Ht 70.0 in | Wt 206.0 lb

## 2019-08-10 DIAGNOSIS — I2584 Coronary atherosclerosis due to calcified coronary lesion: Secondary | ICD-10-CM

## 2019-08-10 DIAGNOSIS — I2510 Atherosclerotic heart disease of native coronary artery without angina pectoris: Secondary | ICD-10-CM

## 2019-08-10 DIAGNOSIS — E785 Hyperlipidemia, unspecified: Secondary | ICD-10-CM | POA: Diagnosis not present

## 2019-08-10 DIAGNOSIS — I10 Essential (primary) hypertension: Secondary | ICD-10-CM

## 2019-08-10 DIAGNOSIS — E663 Overweight: Secondary | ICD-10-CM

## 2019-08-10 NOTE — Patient Instructions (Signed)
Medication Instructions:  Your physician recommends that you continue on your current medications as directed. Please refer to the Current Medication list given to you today.  If you need a refill on your cardiac medications before your next appointment, please call your pharmacy.   Lab work: Your provider would like for you to return within the next few months to have the following labs drawn: fasting lipid. You do not need an appointment for the lab. Once in our office lobby there is a podium where you can sign in and ring the doorbell to alert [place] that you are here. The lab is open from 8:00 am to 4:30 pm; closed for lunch from 12:45pm-1:45pm.  If you have labs (blood work) drawn today and your tests are completely normal, you will receive your results only by: [place] (if you have MyChart) OR A paper copy in the mail If you have any lab test that is abnormal or we need to change your treatment, we will call you to review the results.  Testing/Procedures: None ordered  Follow-Up: At [facility], you and your health needs are our priority.  As part of our continuing mission to provide you with exceptional heart care, we have created designated Provider Care Teams.  These Care Teams include your primary Cardiologist (physician) and Advanced Practice Providers (APPs -  Physician Assistants and Nurse Practitioners) who all work together to provide you with the care you need, when you need it. You will need a follow up appointment in 12 months.  Please call our office 2 months in advance to schedule this appointment.  You may see Sanda Klein, MD or one of the following Advanced Practice Providers on your designated Care Team: Almyra Deforest, PA-C Fabian Sharp, [place]

## 2019-08-10 NOTE — Progress Notes (Signed)
Cardiology Office Note:    Date:  08/11/2019   ID:  [name], DOB 1966/08/03, MRN [id number]  PCP:  Velna Hatchet, MD  Cardiologist:  No primary care provider on file.   Referring MD: Velna Hatchet, MD   Chief Complaint  Patient presents with  . Hypertension    12 month follow up. Patient denies chest pain and SOB. meds reviewed verbally with patient.   . Hyperlipidemia    Advanced coronary calcification for age  Discuss coronary calcium score  History of Present Illness:    Raymond Parsons is a 53 y.o. male with a history of mild hypertension, mixed hyperlipidemia (elevated triglycerides, low HDL, mildly elevated LDL cholesterol), elevated coronary calcium score (47, 92nd percentile for age/gender), but does not have known coronary stenoses, peripheral arterial disease, stroke/TIA or symptoms of atherosclerotic disease.  He had a normal ECG treadmill stress test in October 2019, exercising for just over 12 minutes without ECG changes or symptoms.    He has paid more attention to his diet and is exercising regularly.  He is lost quite a bit of weight but has become stuck at about 198-200 pounds on his home scale (roughly 6 pounds more on our office scale.  His waistline is down to 34 inches.  He exercises at least 4 to 5 days a week, mostly walking.  He does lift some light weights as well.    His most recent labs from May 2020 showed borderline hemoglobin A1c of 5.6% actually slight deterioration from the year before.  LDL was 87, HDL 36, triglycerides 264.  These labs were drawn before he really implemented all the lifestyle changes.  The patient specifically denies any chest pain at rest exertion, dyspnea at rest or with exertion, orthopnea, paroxysmal nocturnal dyspnea, syncope, palpitations, focal neurological deficits, intermittent claudication, lower extremity edema, unexplained weight gain, cough, hemoptysis or wheezing.   Past Medical History:  Diagnosis Date  . History  of colonic diverticulitis    x2  ( 2013 and 2016)  medically manageed  . History of kidney stones   . OSA (obstructive sleep apnea)    per pt study done 2008 approx., told it was moderate osa used cpap until 2 yrs ago after losing wt.  . Recent upper respiratory tract infection    per pt bronchitis finished antibiotic wk of 01-08-2017 ,  states residual  non-productive cough    Past Surgical History:  Procedure Laterality Date  . CYSTOSCOPY/RETROGRADE/URETEROSCOPY/STONE EXTRACTION WITH BASKET  2000  . KNEE ARTHROSCOPY W/ MENISCECTOMY Right 2015  . TONSILLECTOMY AND ADENOIDECTOMY  1978  . VASECTOMY Bilateral 01/17/2017   Procedure: VASECTOMY;  Surgeon: Carolan Clines, MD;  Location: [facility];  Service: Urology;  Laterality: Bilateral;    Current Medications: Current Meds  Medication Sig  . aspirin EC 81 MG tablet Take 81 mg by mouth daily.  . fluticasone (FLONASE) 50 MCG/ACT nasal spray Place 2 sprays into both nostrils 2 (two) times daily.  Marland Kitchen loratadine (CLARITIN) 10 MG tablet Take 10 mg by mouth daily.  Marland Kitchen losartan (COZAAR) 25 MG tablet Take 25 mg by mouth daily.  . Omega-3 Fatty Acids (FISH OIL) 1000 MG CAPS Take 1 capsule by mouth daily.  . Probiotic Product (PROBIOTIC DAILY) CAPS Take 1 capsule by mouth daily.  . rosuvastatin (CRESTOR) 20 MG tablet Take 20 mg by mouth at bedtime.     Allergies:   Ciprofloxacin, Codeine, and Hydrocodone   Social History   Socioeconomic History  .  Marital status: Married    Spouse name: Not on file  . Number of children: Not on file  . Years of education: Not on file  . Highest education level: Not on file  Occupational History  . Not on file  Social Needs  . Financial resource strain: Not on file  . Food insecurity    Worry: Not on file    Inability: Not on file  . Transportation needs    Medical: Not on file    Non-medical: Not on file  Tobacco Use  . Smoking status: Former Research scientist (life sciences)  . Smokeless tobacco: Never  Used  Substance and Sexual Activity  . Alcohol use: Yes    Alcohol/week: 4.0 standard drinks    Types: 2 Glasses of wine, 2 Cans of beer per week  . Drug use: No  . Sexual activity: Not on file  Lifestyle  . Physical activity    Days per week: Not on file    Minutes per session: Not on file  . Stress: Not on file  Relationships  . Social Herbalist on phone: Not on file    Gets together: Not on file    Attends religious service: Not on file    Active member of club or organization: Not on file    Attends meetings of clubs or organizations: Not on file    Relationship status: Not on file  Other Topics Concern  . Not on file  Social History Narrative  . Not on file     Family History: The patient's family history includes Arrhythmia in his mother; Heart attack (age of onset: 38) in his paternal grandfather; Hyperlipidemia in his father, sister, and sister.  ROS:   Please see the history of present illness.     All other systems reviewed and are negative.  EKGs/Labs/Other Studies Reviewed:    The following studies were reviewed today: Coronary calcium score, labs  EKG:  EKG is  ordered today.  It shows normal sinus rhythm incomplete right bundle branch block pattern Recent Labs: No results found for requested labs within last 8760 hours.  Apr 12, 2019 Hemoglobin A1c 5.6%, hemoglobin 16.1, creatinine 1.1, potassium 3.9, liver function tests, TSH 1.18 Lipid Panel  Apr 12, 2019 total cholesterol 176, HDL 36, LDL 87, triglycerides 264   Physical Exam:    VS:  BP 132/80 (BP Location: Left Arm, Patient Position: Sitting, Cuff Size: Normal)   Pulse 73   Ht 5\' 10"  (1.778 m)   Wt 206 lb (93.4 kg)   BMI 29.56 kg/m     Wt Readings from Last 3 Encounters:  08/10/19 206 lb (93.4 kg)  05/14/19 202 lb (91.6 kg)  07/27/18 213 lb (96.6 kg)     General: Alert, oriented x3, no distress, overweight.  He looks visibly leaner and fitter than last time I saw him, but  still has ventral adiposity. Head: no evidence of trauma, PERRL, EOMI, no exophtalmos or lid lag, no myxedema, no xanthelasma; normal ears, nose and oropharynx Neck: normal jugular venous pulsations and no hepatojugular reflux; brisk carotid pulses without delay and no carotid bruits Chest: clear to auscultation, no signs of consolidation by percussion or palpation, normal fremitus, symmetrical and full respiratory excursions Cardiovascular: normal position and quality of the apical impulse, regular rhythm, normal first and second heart sounds, no murmurs, rubs or gallops Abdomen: no tenderness or distention, no masses by palpation, no abnormal pulsatility or arterial bruits, normal bowel sounds, no hepatosplenomegaly Extremities:  no clubbing, cyanosis or edema; 2+ radial, ulnar and brachial pulses bilaterally; 2+ right femoral, posterior tibial and dorsalis pedis pulses; 2+ left femoral, posterior tibial and dorsalis pedis pulses; no subclavian or femoral bruits Neurological: grossly nonfocal Psych: Normal mood and affect   ASSESSMENT:    1. Essential hypertension   2. Dyslipidemia   3. Coronary artery calcification   4. Overweight (BMI 25.0-29.9)    PLAN:    In order of problems listed above:  1. HTN: Good blood pressure control on a very small dose of losartan.  Continued physical activity and weight loss might allow [place] to stop this medication.   2. HLP: I expect will see improvement in his triglycerides and HDL since he is more physically active and has lost weight.  Target LDL less than 70 since he has a markedly elevated calcium score. 3. Coronary calcification: Based on his calcium score/MESA his 10-year risk of major adverse cardiovascular events was 9.3% based on his LDL cholesterol of 135 (before we initiate a statin therapy.  After we started rosuvastatin his LDL became less than 100, but we would preferably get it less than 70.  4. Overweight: Congratulated on his commitment to  healthy lifestyle and successful weight loss.  I acknowledged his frustration with the plateau that he has reached but told him that if he continues to implement healthy lifestyle changes this will improve his long-term health outlook regardless of his actual weight.   Medication Adjustments/Labs and Tests Ordered: Current medicines are reviewed at length with the patient today.  Concerns regarding medicines are outlined above.  Orders Placed This Encounter  Procedures  . Lipid panel  . EKG 12-Lead   No orders of the defined types were placed in this encounter.   Patient Instructions  Medication Instructions:  Your physician recommends that you continue on your current medications as directed. Please refer to the Current Medication list given to you today.  If you need a refill on your cardiac medications before your next appointment, please call your pharmacy.   Lab work: Your provider would like for you to return within the next few months to have the following labs drawn: fasting lipid. You do not need an appointment for the lab. Once in our office lobby there is a podium where you can sign in and ring the doorbell to alert [place] that you are here. The lab is open from 8:00 am to 4:30 pm; closed for lunch from 12:45pm-1:45pm.  If you have labs (blood work) drawn today and your tests are completely normal, you will receive your results only by: [place] (if you have MyChart) OR A paper copy in the mail If you have any lab test that is abnormal or we need to change your treatment, we will call you to review the results.  Testing/Procedures: None ordered  Follow-Up: At [facility], you and your health needs are our priority.  As part of our continuing mission to provide you with exceptional heart care, we have created designated Provider Care Teams.  These Care Teams include your primary Cardiologist (physician) and Advanced Practice Providers (APPs -  Physician Assistants and  Nurse Practitioners) who all work together to provide you with the care you need, when you need it. You will need a follow up appointment in 12 months.  Please call our office 2 months in advance to schedule this appointment.  You may see Sanda Klein, MD or one of the following Advanced Practice Providers on your designated  Care Team: Almyra Deforest, PA-C Fabian Sharp, PA-C          Signed, Sanda Klein, MD  08/11/2019 8:38 AM    [place]

## 2019-08-11 ENCOUNTER — Other Ambulatory Visit (INDEPENDENT_AMBULATORY_CARE_PROVIDER_SITE_OTHER): Payer: 59

## 2019-08-11 DIAGNOSIS — I2510 Atherosclerotic heart disease of native coronary artery without angina pectoris: Secondary | ICD-10-CM

## 2019-08-11 DIAGNOSIS — I2584 Coronary atherosclerosis due to calcified coronary lesion: Secondary | ICD-10-CM | POA: Diagnosis not present

## 2019-08-11 DIAGNOSIS — E785 Hyperlipidemia, unspecified: Secondary | ICD-10-CM | POA: Diagnosis not present

## 2019-08-11 DIAGNOSIS — I10 Essential (primary) hypertension: Secondary | ICD-10-CM

## 2019-10-22 ENCOUNTER — Other Ambulatory Visit: Payer: Self-pay

## 2019-10-22 DIAGNOSIS — Z20822 Contact with and (suspected) exposure to covid-19: Secondary | ICD-10-CM

## 2019-10-24 LAB — NOVEL CORONAVIRUS, NAA: SARS-CoV-2, NAA: NOT DETECTED

## 2020-03-16 ENCOUNTER — Ambulatory Visit: Payer: 59 | Attending: Internal Medicine

## 2020-03-16 DIAGNOSIS — Z23 Encounter for immunization: Secondary | ICD-10-CM

## 2020-03-16 NOTE — Progress Notes (Signed)
   Covid-19 Vaccination Clinic  Name:  Raymond Parsons    MRN: [id number] DOB: 1966/05/22  03/16/2020  Raymond Parsons was observed post Covid-19 immunization for 15 minutes without incident. He was provided with Vaccine Information Sheet and instruction to access the V-Safe system.   Raymond Parsons was instructed to call 911 with any severe reactions post vaccine: Marland Kitchen Difficulty breathing  . Swelling of face and throat  . A fast heartbeat  . A bad rash all over body  . Dizziness and weakness   Immunizations Administered    Name Date Dose VIS Date Route   Pfizer COVID-19 Vaccine 03/16/2020  8:11 AM 0.3 mL 11/12/2019 Intramuscular   Manufacturer: [address]   Lot: [id number]   [place]: [id number]

## 2020-04-11 ENCOUNTER — Ambulatory Visit: Payer: 59 | Attending: Internal Medicine

## 2020-04-11 DIAGNOSIS — Z23 Encounter for immunization: Secondary | ICD-10-CM

## 2020-04-11 NOTE — Progress Notes (Signed)
   Covid-19 Vaccination Clinic  Name:  Raymond Parsons    MRN: [id number] DOB: 13-Sep-1966  04/11/2020  [name] was observed post Covid-19 immunization for 15 minutes without incident. He was provided with Vaccine Information Sheet and instruction to access the V-Safe system.   [name] was instructed to call 911 with any severe reactions post vaccine: Marland Kitchen Difficulty breathing  . Swelling of face and throat  . A fast heartbeat  . A bad rash all over body  . Dizziness and weakness   Immunizations Administered    Name Date Dose VIS Date Route   Pfizer COVID-19 Vaccine 04/11/2020  3:02 PM 0.3 mL 01/26/2019 Intramuscular   Manufacturer: [employer]   Lot: [id number]   [place]: [id number]

## 2020-07-07 ENCOUNTER — Telehealth: Payer: Self-pay | Admitting: Cardiovascular Disease

## 2020-07-07 NOTE — Telephone Encounter (Signed)
Patient states he is requesting to speak with Dr. Caryl Comes in regards to his daughter.

## 2020-07-07 NOTE — Telephone Encounter (Signed)
Spoke with pt's wife who states he is on a business call currently but pt and Dr Caryl Comes are friends and he was calling Dr Caryl Comes for advice on his daughter who was seen in an UC in [place], [place] and EKG shows PAC's.  It was recommended daughter follow up with her PCP.  Advised Dr Caryl Comes is out of the office until next Tuesday 07/11/2020.  Daughter is not a pt of Dr Caryl Comes.  Advised daughter should follow up with PCP as recommended,  Will forward phone call information to Dr Caryl Comes.

## 2020-09-07 ENCOUNTER — Telehealth: Payer: Self-pay | Admitting: Cardiovascular Disease

## 2020-09-07 NOTE — Telephone Encounter (Signed)
lvm for patient to return call to get follow up scheduled with [place] from recall list

## 2020-10-17 NOTE — Telephone Encounter (Signed)
Error

## 2023-10-17 ENCOUNTER — Other Ambulatory Visit (HOSPITAL_BASED_OUTPATIENT_CLINIC_OR_DEPARTMENT_OTHER): Payer: Self-pay | Admitting: Orthopedic Surgery

## 2023-10-17 DIAGNOSIS — R5383 Other fatigue: Secondary | ICD-10-CM

## 2023-10-20 ENCOUNTER — Ambulatory Visit (HOSPITAL_BASED_OUTPATIENT_CLINIC_OR_DEPARTMENT_OTHER)
Admission: RE | Admit: 2023-10-20 | Discharge: 2023-10-20 | Disposition: A | Discharge status: Home / Self Care | Payer: 59 | Source: Ambulatory Visit | Attending: Orthopedic Surgery | Admitting: Orthopedic Surgery

## 2023-10-20 DIAGNOSIS — R5383 Other fatigue: Secondary | ICD-10-CM | POA: Insufficient documentation

## 2024-01-28 ENCOUNTER — Other Ambulatory Visit (HOSPITAL_BASED_OUTPATIENT_CLINIC_OR_DEPARTMENT_OTHER): Payer: Self-pay

## 2024-01-28 MED ORDER — BENZONATATE 100 MG PO CAPS
100.0000 mg | ORAL_CAPSULE | Freq: Three times a day (TID) | ORAL | 0 refills | Status: DC | PRN
  Filled 2024-01-28: qty 30, 10d supply, fill #0

## 2024-01-28 MED ORDER — FLUTICASONE FUROATE-VILANTEROL 100-25 MCG/ACT IN AEPB
1.0000 | INHALATION_SPRAY | Freq: Every day | RESPIRATORY_TRACT | 3 refills | Status: DC
  Filled 2024-01-28: qty 60, 30d supply, fill #0

## 2024-01-28 MED ORDER — MONTELUKAST SODIUM 10 MG PO TABS
10.0000 mg | ORAL_TABLET | Freq: Every day | ORAL | 3 refills | Status: DC
  Filled 2024-01-28: qty 30, 30d supply, fill #0

## 2024-01-29 ENCOUNTER — Other Ambulatory Visit (HOSPITAL_BASED_OUTPATIENT_CLINIC_OR_DEPARTMENT_OTHER): Payer: Self-pay

## 2024-02-04 NOTE — Progress Notes (Unsigned)
 No chief complaint on file.  History of Present Illness: 58 yo male with history of diverticulitis, kidney stones and evidence of CAD with recent abnormal CT coronary calcium score who is here as a new patient today to establish cardiology care. He has been seen in the past by Dr. Royann Shivers. Abnormal calcium score of 147 in 2019. Normal exercise stress test in 2019. He has been on ASA and Crestor. Recent coronary calcium score of ***  Primary Care Physician: Alysia Penna, MD   Past Medical History:  Diagnosis Date   History of colonic diverticulitis    x2  ( 2013 and 2016)  medically manageed   History of kidney stones    OSA (obstructive sleep apnea)    per pt study done 2008 approx., told it was moderate osa used cpap until 2 yrs ago after losing wt.   Recent upper respiratory tract infection    per pt bronchitis finished antibiotic wk of 01-08-2017 ,  states residual  non-productive cough    Past Surgical History:  Procedure Laterality Date   CYSTOSCOPY/RETROGRADE/URETEROSCOPY/STONE EXTRACTION WITH BASKET  2000   KNEE ARTHROSCOPY W/ MENISCECTOMY Right 2015   TONSILLECTOMY AND ADENOIDECTOMY  1978   VASECTOMY Bilateral 01/17/2017   Procedure: VASECTOMY;  Surgeon: Jethro Bolus, MD;  Location[facility];  Service: Urology;  Laterality: Bilateral;    Current Outpatient Medications  Medication Sig Dispense Refill   aspirin EC 81 MG tablet Take 81 mg by mouth daily.     benzonatate (TESSALON) 100 MG capsule Take 1 capsule (100 mg total) by mouth 3 (three) times daily as needed for cough. 30 capsule 0   fluticasone (FLONASE) 50 MCG/ACT nasal spray Place 2 sprays into both nostrils 2 (two) times daily.  2   fluticasone furoate-vilanterol (BREO ELLIPTA) 100-25 MCG/ACT AEPB Inhale 1 puff into the lungs daily. Rinse mouth after use. 60 each 3   loratadine (CLARITIN) 10 MG tablet Take 10 mg by mouth daily.     losartan (COZAAR) 25 MG tablet Take 25 mg by mouth  daily.     montelukast (SINGULAIR) 10 MG tablet Take 1 tablet (10 mg total) by mouth at bedtime. 90 tablet 3   Omega-3 Fatty Acids (FISH OIL) 1000 MG CAPS Take 1 capsule by mouth daily.     Probiotic Product (PROBIOTIC DAILY) CAPS Take 1 capsule by mouth daily.     rosuvastatin (CRESTOR) 20 MG tablet Take 20 mg by mouth at bedtime.     No current facility-administered medications for this visit.    Allergies  Allergen Reactions   Ciprofloxacin Rash   Codeine Nausea And Vomiting   Hydrocodone Rash    Social History   Socioeconomic History   Marital status: Married    Spouse name: Not on file   Number of children: Not on file   Years of education: Not on file   Highest education level: Not on file  Occupational History   Not on file  Tobacco Use   Smoking status: Former   Smokeless tobacco: Never  Vaping Use   Vaping status: Never Used  Substance and Sexual Activity   Alcohol use: Yes    Alcohol/week: 4.0 standard drinks of alcohol    Types: 2 Glasses of wine, 2 Cans of beer per week   Drug use: No   Sexual activity: Not on file  Other Topics Concern   Not on file  Social History Narrative   Not on file   Social  Drivers of Corporate investment banker Strain: Not on file  Food Insecurity: Not on file  Transportation Needs: Not on file  Physical Activity: Not on file  Stress: Not on file  Social Connections: Not on file  Intimate Partner Violence: Not on file    Family History  Problem Relation Age of Onset   Arrhythmia Mother    Hyperlipidemia Father    Hyperlipidemia Sister        triglycerides   Heart attack Paternal Grandfather 15   Hyperlipidemia Sister        triglycerides    Review of Systems:  As stated in the HPI and otherwise negative.   There were no vitals taken for this visit.  Physical Examination: General: Well developed, well nourished, NAD  HEENT: OP clear, mucus membranes moist  SKIN: warm, dry. No rashes. Neuro: No focal deficits   Musculoskeletal: Muscle strength 5/5 all ext  Psychiatric: Mood and affect normal  Neck: No JVD, no carotid bruits, no thyromegaly, no lymphadenopathy.  Lungs:Clear bilaterally, no wheezes, rhonci, crackles Cardiovascular: Regular rate and rhythm. No murmurs, gallops or rubs. Abdomen:Soft. Bowel sounds present. Non-tender.  Extremities: No lower extremity edema. Pulses are 2 + in the bilateral DP/PT.  EKG:  EKG {ACTION; IS/IS LKG:[id number]} ordered today. The ekg ordered today demonstrates ***  Recent Labs: No results found for requested labs within last 365 days.   Lipid Panel No results found for: "CHOL", "TRIG", "HDL", "CHOLHDL", "VLDL", "LDLCALC", "LDLDIRECT"   Wt Readings from Last 3 Encounters:  08/10/19 93.4 kg  05/14/19 91.6 kg  07/27/18 96.6 kg    Assessment and Plan:   1. CAD without angina: ***  Labs/ tests ordered today include:  No orders of the defined types were placed in this encounter.  Disposition:   F/U with me in ***   Signed, Verne Carrow, MD, [facility] 02/04/2024 2:20 PM    [facility] [address] [place], [place], [place]  [address] Phone: [phone]; Fax: [phone]

## 2024-02-05 ENCOUNTER — Encounter: Payer: Self-pay | Admitting: Cardiovascular Disease

## 2024-02-05 ENCOUNTER — Ambulatory Visit: Attending: Cardiovascular Disease | Admitting: Cardiovascular Disease

## 2024-02-05 VITALS — BP 142/80 | HR 92 | Ht 70.0 in | Wt 215.0 lb

## 2024-02-05 DIAGNOSIS — E7800 Pure hypercholesterolemia, unspecified: Secondary | ICD-10-CM | POA: Diagnosis not present

## 2024-02-05 DIAGNOSIS — I2510 Atherosclerotic heart disease of native coronary artery without angina pectoris: Secondary | ICD-10-CM | POA: Diagnosis not present

## 2024-02-05 NOTE — Patient Instructions (Addendum)
 Medication Instructions:  No changes *If you need a refill on your cardiac medications before your next appointment, please call your pharmacy*   Lab Work: Go to the first floor [facility] for blood work (lipids/liver function)  If you have labs (blood work) drawn today and your tests are completely normal, you will receive your results only by: [employer] (if you have MyChart) OR A paper copy in the mail If you have any lab test that is abnormal or we need to change your treatment, we will call you to review the results.   Testing/Procedures: Your physician has requested that you have an exercise tolerance test. For further information please visit [email or web address] Please also follow instruction sheet, as given.   Follow-Up: At [facility], you and your health needs are our priority.  As part of our continuing mission to provide you with exceptional heart care, we have created designated Provider Care Teams.  These Care Teams include your primary Cardiologist (physician) and Advanced Practice Providers (APPs -  Physician Assistants and Nurse Practitioners) who all work together to provide you with the care you need, when you need it.   Your next appointment:   12 month(s)  Provider:   Verne Carrow, MD     Exercise Tolerance Test  Please arrive 15 minutes prior to your appointment time for registration and insurance purposes.  The test will take approximately 45 minutes to complete.  How to prepare for your Exercise Stress Test: Do bring a list of your current medications with you.  If not listed below, you may take your medications as normal. Do wear comfortable clothes (no dresses or overalls) and walking shoes, tennis shoes preferred (no heels or open toed shoes are allowed) Do Not wear cologne, perfume, aftershave or lotions (deodorant is allowed). Please report to [address], [address] for your test.  If these instructions are not followed,  your test will have to be rescheduled.  If you have questions or concerns about your appointment, you can call the Stress Lab at [phone].  If you cannot keep your appointment, please provide 24 hours notification to the Stress Lab, to avoid a possible $50 charge to your account.

## 2024-02-10 LAB — LIPID PANEL
Chol/HDL Ratio: 3.1 ratio (ref 0.0–5.0)
Cholesterol, Total: 147 mg/dL (ref 100–199)
HDL: 48 mg/dL (ref >39)
LDL Chol Calc (NIH): 77 mg/dL (ref 0–99)
Triglycerides: 123 mg/dL (ref 0–149)
VLDL Cholesterol Cal: 22 mg/dL (ref 5–40)

## 2024-02-10 LAB — HEPATIC FUNCTION PANEL
ALT: 31 IU/L (ref 0–44)
AST: 30 IU/L (ref 0–40)
Albumin: 4.9 g/dL (ref 3.8–4.9)
Alkaline Phosphatase: 48 IU/L (ref 44–121)
Bilirubin Total: 0.5 mg/dL (ref 0.0–1.2)
Bilirubin, Direct: 0.17 mg/dL (ref 0.00–0.40)
Total Protein: 6.6 g/dL (ref 6.0–8.5)

## 2024-02-12 ENCOUNTER — Telehealth: Payer: Self-pay | Admitting: *Deleted

## 2024-02-12 ENCOUNTER — Other Ambulatory Visit (HOSPITAL_BASED_OUTPATIENT_CLINIC_OR_DEPARTMENT_OTHER): Payer: Self-pay

## 2024-02-12 DIAGNOSIS — I2510 Atherosclerotic heart disease of native coronary artery without angina pectoris: Secondary | ICD-10-CM

## 2024-02-12 DIAGNOSIS — E7800 Pure hypercholesterolemia, unspecified: Secondary | ICD-10-CM

## 2024-02-12 MED ORDER — ROSUVASTATIN CALCIUM 40 MG PO TABS
40.0000 mg | ORAL_TABLET | Freq: Every day | ORAL | 3 refills | Status: AC
  Filled 2024-02-12: qty 30, 30d supply, fill #0
  Filled 2024-04-15 – 2024-05-18 (×4): qty 30, 30d supply, fill #1

## 2024-02-12 NOTE — Telephone Encounter (Signed)
 Called and spoke with patient.  Reviewed recommendations.  He is in agreement.

## 2024-02-12 NOTE — Telephone Encounter (Signed)
-----   Message from Verne Carrow sent at 02/11/2024  4:07 PM EDT ----- LDL not at goal. LFTs ok. Would have him increase Crestor to 40 mg daily and repeat lipids and LFTS in 12 weeks. Thanks, chris

## 2024-02-18 ENCOUNTER — Ambulatory Visit: Attending: Cardiovascular Disease

## 2024-02-18 DIAGNOSIS — E7800 Pure hypercholesterolemia, unspecified: Secondary | ICD-10-CM | POA: Diagnosis not present

## 2024-02-18 DIAGNOSIS — I2510 Atherosclerotic heart disease of native coronary artery without angina pectoris: Secondary | ICD-10-CM

## 2024-02-18 LAB — EXERCISE TOLERANCE TEST
Angina Index: 0
Base ST Depression (mm): 0 mm
Duke Treadmill Score: 8
Estimated workload: 10.1
Exercise duration (min): 8 min
Exercise duration (sec): 0 s
MPHR: 164 {beats}/min
Peak HR: 151 {beats}/min
Percent HR: 92 %
RPE: 15
Rest HR: 97 {beats}/min
ST Depression (mm): 0 mm

## 2024-02-23 ENCOUNTER — Telehealth: Payer: Self-pay | Admitting: Cardiovascular Disease

## 2024-02-23 NOTE — Telephone Encounter (Signed)
 Raymond Hazel, MD 02/18/2024  3:31 PM EDT     Normal stress test. Can we let him know? [name]   Patient called w/results.

## 2024-02-23 NOTE — Telephone Encounter (Signed)
 Pt requesting cb to further discuss stress test results, says someone was supposed to be calling him back

## 2024-04-12 ENCOUNTER — Ambulatory Visit: Admitting: Cardiovascular Disease

## 2024-04-13 ENCOUNTER — Other Ambulatory Visit (HOSPITAL_BASED_OUTPATIENT_CLINIC_OR_DEPARTMENT_OTHER): Payer: Self-pay

## 2024-04-13 MED ORDER — ICOSAPENT ETHYL 1 G PO CAPS
2.0000 g | ORAL_CAPSULE | Freq: Two times a day (BID) | ORAL | 2 refills | Status: AC
  Filled 2024-04-13: qty 120, 30d supply, fill #0
  Filled 2024-05-07 – 2024-05-18 (×2): qty 120, 30d supply, fill #1

## 2024-04-13 MED ORDER — LOSARTAN POTASSIUM 25 MG PO TABS
25.0000 mg | ORAL_TABLET | Freq: Every morning | ORAL | 3 refills | Status: AC
  Filled 2024-04-13: qty 30, 30d supply, fill #0
  Filled 2024-05-07 – 2024-05-18 (×2): qty 30, 30d supply, fill #1

## 2024-04-14 ENCOUNTER — Other Ambulatory Visit (HOSPITAL_BASED_OUTPATIENT_CLINIC_OR_DEPARTMENT_OTHER): Payer: Self-pay

## 2024-04-15 ENCOUNTER — Other Ambulatory Visit: Payer: Self-pay

## 2024-04-15 ENCOUNTER — Other Ambulatory Visit (HOSPITAL_BASED_OUTPATIENT_CLINIC_OR_DEPARTMENT_OTHER): Payer: Self-pay

## 2024-04-15 MED ORDER — MONTELUKAST SODIUM 10 MG PO TABS
10.0000 mg | ORAL_TABLET | Freq: Every day | ORAL | 3 refills | Status: AC
  Filled 2024-04-15: qty 30, 30d supply, fill #0

## 2024-04-27 ENCOUNTER — Other Ambulatory Visit (HOSPITAL_BASED_OUTPATIENT_CLINIC_OR_DEPARTMENT_OTHER): Payer: Self-pay

## 2024-05-08 ENCOUNTER — Other Ambulatory Visit (HOSPITAL_BASED_OUTPATIENT_CLINIC_OR_DEPARTMENT_OTHER): Payer: Self-pay

## 2024-05-17 ENCOUNTER — Other Ambulatory Visit (HOSPITAL_BASED_OUTPATIENT_CLINIC_OR_DEPARTMENT_OTHER): Payer: Self-pay

## 2024-05-28 ENCOUNTER — Other Ambulatory Visit (HOSPITAL_BASED_OUTPATIENT_CLINIC_OR_DEPARTMENT_OTHER): Payer: Self-pay

## 2024-06-17 LAB — COMPREHENSIVE METABOLIC PANEL WITH GFR: EGFR (Non-African Amer.): 69.2

## 2024-06-17 LAB — HEMOGLOBIN A1C: A1c: 6

## 2024-06-17 LAB — TSH: TSH: 1.02

## 2024-06-17 LAB — PSA: PSA, Total: 1.8

## 2024-06-22 ENCOUNTER — Encounter: Payer: Self-pay | Admitting: Internal Medicine

## 2024-11-29 ENCOUNTER — Ambulatory Visit: Admitting: Podiatry

## 2024-11-29 ENCOUNTER — Ambulatory Visit (INDEPENDENT_AMBULATORY_CARE_PROVIDER_SITE_OTHER)

## 2024-11-29 ENCOUNTER — Encounter: Payer: Self-pay | Admitting: Podiatry

## 2024-11-29 DIAGNOSIS — M7751 Other enthesopathy of right foot: Secondary | ICD-10-CM

## 2024-11-29 MED ORDER — TRIAMCINOLONE ACETONIDE 10 MG/ML IJ SUSP
10.0000 mg | Freq: Once | INTRAMUSCULAR | Status: AC
  Administered 2024-11-29: 10 mg via INTRA_ARTICULAR

## 2024-12-01 NOTE — Progress Notes (Signed)
 Subjective:   Patient ID: Raymond Parsons, male   DOB: 58 y.o.   MRN: [id number]   HPI Patient presents stating having a lot of pain in the bottom of his right foot x 3 months.  He has been stretching he has tried shoe gear modifications he has been walking a lot and getting ready to do a long hike in the fall of next year.  Patient does not smoke and likes to be active   Review of Systems  All other systems reviewed and are negative.       Objective:  Physical Exam Vitals and nursing note reviewed.  Constitutional:      Appearance: He is well-developed.  Pulmonary:     Effort: Pulmonary effort is normal.  Musculoskeletal:        General: Normal range of motion.  Skin:    General: Skin is warm.  Neurological:     Mental Status: He is alert.     Neurovascular status intact muscle strength found to be adequate range of motion within normal limits with quite a bit of pain and swelling of the second MPJ right fluid buildup around the joint surface with the patient found to have good digital perfusion well oriented x 3 moderate depression of the arch     Assessment:  Acute capsulitis second MPJ with increased activity and probable low-grade injury     Plan:  H&P education x-rays reviewed and at this time I did a forefoot block right I did sterile prep I aspirated the second MPJ getting out a small amount of clear fluid I injected quarter cc dexamethasone  Kenalog  I applied thick plantar pad to offload weight I discussed orthotics which I think would be good for him long-term and patient will be reevaluated again in the next several weeks  X-rays indicate no signs of fracture did indicate a normal metatarsal parabola and no hammertoe deformity

## 2024-12-20 ENCOUNTER — Encounter: Payer: Self-pay | Admitting: Podiatry

## 2024-12-20 ENCOUNTER — Ambulatory Visit: Admitting: Podiatry

## 2024-12-20 DIAGNOSIS — M7751 Other enthesopathy of right foot: Secondary | ICD-10-CM | POA: Diagnosis not present

## 2024-12-20 DIAGNOSIS — M7752 Other enthesopathy of left foot: Secondary | ICD-10-CM | POA: Diagnosis not present

## 2024-12-20 NOTE — Progress Notes (Signed)
 Subjective:   Patient ID: Raymond Parsons, male   DOB: 59 y.o.   MRN: [id number]   HPI Patient states doing much better I was able to hike 5 miles still having mild discomfort and have deformity   ROS      Objective:  Physical Exam  Neurovascular status intact patient has mild digital deformity bilateral right over left with inflammation around the lesser MPJs right over left improving     Assessment:  Chronic capsulitis of the right over left foot with inflammation of the MPJ joint surfaces with mild hammertoe deformity     Plan:  H&P education rendered discussed his hiking in the fall of excessive nature and at this point I went ahead and casted for functional orthotic devices to offload weight and reappoint to recheck again when orthotics return in 4 weeks and I will reevaluate where he stands at that time with increased activity.  All questions answered

## 2025-01-17 ENCOUNTER — Ambulatory Visit: Admitting: Podiatry

## 2025-02-22 ENCOUNTER — Ambulatory Visit: Admitting: Cardiovascular Disease
